# Patient Record
Sex: Male | Born: 1940 | Race: White | Hispanic: No | Marital: Married | Smoking: Never smoker
Health system: Southern US, Community
[De-identification: ages and names within clinical notes are randomized; demographics above are authoritative.]

## PROBLEM LIST (undated history)

## (undated) DIAGNOSIS — I499 Cardiac arrhythmia, unspecified: Secondary | ICD-10-CM

## (undated) DIAGNOSIS — E039 Hypothyroidism, unspecified: Secondary | ICD-10-CM

## (undated) DIAGNOSIS — N200 Calculus of kidney: Secondary | ICD-10-CM

## (undated) DIAGNOSIS — I350 Nonrheumatic aortic (valve) stenosis: Secondary | ICD-10-CM

## (undated) DIAGNOSIS — M069 Rheumatoid arthritis, unspecified: Secondary | ICD-10-CM

## (undated) DIAGNOSIS — N189 Chronic kidney disease, unspecified: Secondary | ICD-10-CM

## (undated) DIAGNOSIS — R112 Nausea with vomiting, unspecified: Secondary | ICD-10-CM

## (undated) DIAGNOSIS — Z950 Presence of cardiac pacemaker: Secondary | ICD-10-CM

## (undated) DIAGNOSIS — F32A Depression, unspecified: Secondary | ICD-10-CM

## (undated) DIAGNOSIS — Z9889 Other specified postprocedural states: Secondary | ICD-10-CM

## (undated) DIAGNOSIS — I10 Essential (primary) hypertension: Secondary | ICD-10-CM

## (undated) DIAGNOSIS — J45909 Unspecified asthma, uncomplicated: Secondary | ICD-10-CM

## (undated) DIAGNOSIS — M1990 Unspecified osteoarthritis, unspecified site: Secondary | ICD-10-CM

## (undated) DIAGNOSIS — Z87442 Personal history of urinary calculi: Secondary | ICD-10-CM

## (undated) DIAGNOSIS — Z9689 Presence of other specified functional implants: Secondary | ICD-10-CM

## (undated) DIAGNOSIS — K219 Gastro-esophageal reflux disease without esophagitis: Secondary | ICD-10-CM

## (undated) HISTORY — PX: JOINT REPLACEMENT: SHX530

## (undated) HISTORY — PX: TONSILLECTOMY: SUR1361

## (undated) HISTORY — PX: BACK SURGERY: SHX140

## (undated) HISTORY — PX: INSERT / REPLACE / REMOVE PACEMAKER: SUR710

---

## 2002-06-28 ENCOUNTER — Ambulatory Visit (HOSPITAL_COMMUNITY): Admission: RE | Admit: 2002-06-28 | Discharge: 2002-06-28 | Payer: Self-pay | Admitting: Rheumatology

## 2002-10-18 ENCOUNTER — Encounter: Payer: Self-pay | Admitting: Orthopedic Surgery

## 2002-10-21 ENCOUNTER — Inpatient Hospital Stay (HOSPITAL_COMMUNITY): Admission: RE | Admit: 2002-10-21 | Discharge: 2002-10-24 | Payer: Self-pay | Admitting: Orthopedic Surgery

## 2003-02-26 ENCOUNTER — Encounter (HOSPITAL_COMMUNITY): Admission: RE | Admit: 2003-02-26 | Discharge: 2003-05-27 | Payer: Self-pay | Admitting: Internal Medicine

## 2003-02-26 ENCOUNTER — Encounter: Payer: Self-pay | Admitting: Internal Medicine

## 2003-10-20 ENCOUNTER — Inpatient Hospital Stay (HOSPITAL_COMMUNITY): Admission: RE | Admit: 2003-10-20 | Discharge: 2003-10-22 | Payer: Self-pay | Admitting: Orthopedic Surgery

## 2008-03-31 ENCOUNTER — Encounter: Admission: RE | Admit: 2008-03-31 | Discharge: 2008-03-31 | Payer: Self-pay | Admitting: Rheumatology

## 2011-04-29 NOTE — H&P (Signed)
NAME:  George Johnston, George Johnston NO.:  [id number]   MEDICAL RECORD NO.:  [id number]                   PATIENT TYPE:  INP   LOCATION:                                       FACILITY:  [facility]   PHYSICIAN:  Mila Homer. Sherlean Foot, M.D.              DATE OF BIRTH:  [birth year]   DATE OF ADMISSION:  10/20/2003  DATE OF DISCHARGE:                                HISTORY & PHYSICAL   CHIEF COMPLAINT:  Right knee pain.   HISTORY OF PRESENT ILLNESS:  George Johnston is a 70 year old white male with eight  to nine month history of right knee pain.  The patient recently underwent a  left total knee arthroplasty on October 22, 2003, and doing remarkably  well.  Chronic knee pain is described as a sharp knifelike pain in the  medial aspect of the right knee.  Mechanical symptoms include catching.  he  has no assistive devices.  Most of his right knee pain occurs with  inactivity at night, sitting for long periods of time or walking long  distances.  The patient has tried and failed conservative treatment.  Therefore, the patient will be admitted to [facility]  on October 20, 2003, for right total knee replacement.  X-rays of the right  knee show mainly medial compartment collapse of the right knee.   ALLERGIES:  ERYTHROMYCIN causes nausea.   MEDICATIONS:  1. Sulfasalazine 500 mg four times a day.  2. Prednisone 500 mg p.o. b.i.d.  3. Sulindac 200 mg p.o. b.i.d.  4. Triamterene/hydrochlorothiazide 75/50 one p.o. daily.  5. Lipitor 10 mg one p.o. daily.   PAST MEDICAL HISTORY:  1. Hypertension.  2. Hypercholesterolemia.  3. Arthritis.   PAST SURGICAL HISTORY:  1. Tonsillectomy in 1949.  2. Left knee scope in 1998.  3. Left knee scope in 2000.  4. Left knee replacement in 2003.  The patient states he had no complications with the anesthesia involved with  any of the above procedures.  He required no blood products.   SOCIAL HISTORY:  Denies any tobacco use.   He drinks approximately a glass of  wine daily.  He is married.  He has adult children.  He lives in a two-story  home with four steps to the usual entrance.  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   PRIMARY CARE PHYSICIAN:  Buren Kos, M.D., phone number is [phone].   FAMILY HISTORY:  Mother is alive at age 10, the only known health problem is  hypertension.  Father is deceased of heart attack at age 67.  He also had  diabetes mellitus.  He has one living sister whose only known health problem  is breast cancer which she is in remission from.   REVIEW OF SYMPTOMS:  The patient denies any chest pain, shortness of breath,  PND, orthopnea.  He does  wear glasses.  He has nocturia two times a night.  Recent colonoscopy in April of this year and reported being benign.  Otherwise review of systems is negative.   PHYSICAL EXAMINATION:  GENERAL APPEARANCE:  A well-developed, well-nourished  male who walks with a slight limp on the right.  He has a mild varus  deformity of the right knee.  Mood and affect are appropriate.  The patient  talks easily with the examiner.  VITAL SIGNS:  Height 5 feet 11 inches, weight 198 pounds.  Temperature 96.5  degrees F.  Pulse 70, respiratory rate 16, blood pressure 115/80.  HEENT:  Normocephalic and atraumatic without frontal or maxillary sinus  tenderness to palpation.  Conjunctivae pink bilaterally.  Sclerae are  nonicteric bilaterally.  No visible or external ear deformities are noted.  TMs are pearly and gray.  Ear canal does have a moderate amount of cerumen.  Nasal septum midline.  Nasal mucosa is pink and moist without polyps.  Buccal mucosa pink and moist.  Good dentition.  Pharynx without exudate or  erythema. Tongue and uvula are midline.  NECK:  Trachea midline.  No lymphadenopathy.  Carotids are 2+ without  bruits.  No tenderness along the cervical spine.  Full range of motion of  the cervical spine.  BACK:  Nontender to palpation  over the thoracic and lumbar spine.  LUNGS:  Clear to auscultation bilaterally.  CARDIOVASCULAR:  Regular rate and rhythm with no murmurs, rubs, or gallops  noted.  ABDOMEN:  Nontender to palpation.  Positive bowel sounds throughout.  No  hepatomegaly or splenomegaly.  MUSCULOSKELETAL:  Upper extremities full range of motion.  Otherwise upper  extremities neurovascular and motor intact.  Lower extremities show  bilateral hips with full range of motion.  Right knee positive for crepitus,  full extension, flexed to 110 degrees.  Mild varus deformity noted. Medial  joint line tenderness to palpation.  Varus stressing reveals no laxity  causing some mild medial joint pain.  Mild edema noted about the knee.  Lower legs with no edema.  Dorsal and pedal pulses are 2+ bilaterally and  equal and symmetric.  Left knee is 0 to 120 degrees flexion.  No tenderness  along the incision.  The incision is well healed.  NEUROLOGIC:  The patient is alert and oriented x3.  Cranial nerves II-XII  are grossly intact.  Strength of the upper extremities revealed 5/5  throughout.  Deep tendon reflexes of the upper extremities are 2+  bilaterally and equal and symmetric.  Lower leg strength and resistance are  5/5 throughout.  Deep tendon reflexes are 2+bilaterally and are equal and  symmetric.   IMPRESSION:  1. Right knee medial compartment osteoarthritis.  2. Left total knee arthroplasty October 22, 2003.  3. Hypertension.  4. Hypercholesterolemia.   PLAN:  The patient will be admitted to [facility] on  October 20, 2003, and undergo a right total knee replacement.     Richardean Canal, P.A.                       Mila Homer. Sherlean Foot, M.D.   GC/MEDQ  D:  10/14/2003  T:  10/14/2003  Job:  [id number]

## 2011-04-29 NOTE — Op Note (Signed)
NAME:  George Johnston, George Johnston                           ACCOUNT NO.:  [id number]   MEDICAL RECORD NO.:  [id number]                   PATIENT TYPE:  INP   LOCATION:  5039                                 FACILITY:  [facility]   PHYSICIAN:  Mila Homer. Sherlean Foot, M.D.              DATE OF BIRTH:  05-10-1941   DATE OF PROCEDURE:  10/21/2002  DATE OF DISCHARGE:                                 OPERATIVE REPORT   PREOPERATIVE DIAGNOSIS:  Left knee osteoarthritis.   POSTOPERATIVE DIAGNOSIS:  Left knee osteoarthritis.   OPERATION/PROCEDURE:  Left total knee arthroplasty.   SURGEON:  Mila Homer. Sherlean Foot, M.D.   ASSISTANT:  Legrand Pitts. Duffy, P.A.   ANESTHESIA:  Spinal.   INDICATIONS:  The patient is status post failure of conservative measures  for osteoarthritis of the knee.  Informed consent was obtained.   DESCRIPTION OF PROCEDURE:  The patient was laid supine and administered  general anesthesia.  The left lower extremity was prepped and draped in the  usual sterile fashion.  Straight midline incision was made with a #10 blade.  [place] was used to make a median parapatellar arthrotomy.  There was lots  and lots of synovitis in the joint.  A complete synovectomy was performed.  At this point the patella was everted and measured at 25 mm and then reamed  down to 16 mm and a 35 mm template was used to drill three lug holes. With  the prosthetic patellar trial in place, it also measured 25 mm.  At this  point we left the patella everted, created a medial subperiosteal sleeve due  to the deep MCL ligament and went around to but not through the  semimembranosus tendon.  Then I brought the knee into flexion, cut the ACL  and PCL, delivered the two anteriorly.  Using the external medullary  alignment guide, used to make a cut to remove 2 mm of bone off of the medial  total plateau.  At this point I made that cut, removed the excess bone and  turned attention to the femur.  _______ I drilled a drill hole in the  femur.  I tamped on the intramedullary guide, then came down and pinned the distal  femoral cutting block set at 6 degrees valgus cut and this was a three  degree distance from the cutting block.  I removed the intramedullary guide  and made a distal femoral cut and removed the distal femoral cutting block.  I then drew the intercondylar access and the posterior condylar angle  measured 3 degrees, so I pinned it through the 3 degree hole and _________  to the size 8 to always downsize the components.  Then made the anterior,  posterior and chamfer cuts.  We then placed a lamina spreader to the lateral  compartment, removed the posterior medial osteophytes, medial meniscus,  ACL  and PCL.  I then placed a lamina  spreader in the medial compartment to the  lateral meniscus, posterolateral condylar osteophytes.  I then used spacer  blocks to check my alignment and realized I had made somewhat of a varus cut  and so I recut the tibia, taking 2 mm more bone off the lateral side.  After  having done this, the cut looked very perpendicular to the anatomic axis of  the tibia and with a 14 spacer block in place, I had good flexion,  extension, and gap balance.  I then finished the femur with the finishing  block, cutting our notch, drilling our lug holes.  I finished the tibia with  a size 7 tray and then with a size 7 tibia size E femur, size 14 trial  insert, and a size 35 patella, I had excellent flexion, extension, and gap  balance.  Full flexion, drop and dangle was back to 120 degrees.  At this  point I removed the trial components, irrigated copiously and then cemented  and sized the tibia first, sized _________ second, removing all excess  cement, snapped in the real E line polyethylene insert and went into  extension and then cemented in the patella component.  Once I had done this,  I let the cement harden and then let the tourniquet down and cauterized all  bleeding vessels.  I then left  the Hemovac deep in the arthrotomy, closed  the arthrotomy with interrupted #1 Vicryl sutures, the soft tissues with  interrupted 0 Vicryl suture running subcuticular Vicryl stitch and then the  skin staples placed in flexion and cleared.  Dressed with Adaptic 4 x 4s,  sterile Webril and TED stocking.  No complications.  Drains - one Hemovac.  Tourniquet time was exactly 60 minutes.  Estimated blood loss 300 cc.                                                Mila Homer. Sherlean Foot, M.D.    SDL/MEDQ  D:  10/21/2002  T:  10/21/2002  Job:  [id number]

## 2011-04-29 NOTE — H&P (Signed)
NAME:  George Johnston, George Johnston                           ACCOUNT NO.:  [id number]   MEDICAL RECORD NO.:  [id number]                   PATIENT TYPE:  INP   LOCATION:  NA                                   FACILITY:  [facility]   PHYSICIAN:  Mila Homer. Sherlean Foot, M.D.              DATE OF BIRTH:  December 24, 1940   DATE OF ADMISSION:  DATE OF DISCHARGE:                                HISTORY & PHYSICAL   CHIEF COMPLAINT:  Progressively worsening left knee pain since 1995.   HISTORY OF PRESENT ILLNESS:  This 70 year old white male patient presented  to Dr. Sherlean Foot with a history of a left knee arthroscopy by Dr. Darrelyn Hillock in  1995 and by Dr. Thurston Hole in 1999. He had a gradual onset of progressive  worsening left knee pain since about 1995. He has had no known injury to the  knee. The pain has been getting worse and is known described as a constant  throbbing pain which becomes sharp at times, located mostly over the medial  aspect of the knee. The patient also seems to radiate into the posterior  aspect of the knee and from his mid calf to mid thigh, especially at night.  The pain increases with any extended period of time where he has to walk and  then decreases with the use of OxyContin. The knee does grind and feels like  it might give way at times. It also swells and keeps him up a fair amount at  night. He is very stiff first thing in the morning. There is no popping,  locking, or catching. He does wear a pull-over knee sleeve on his knee at  times and that does help him get around. He had been getting cortisone  injections about every six weeks, but the last injection he got several  weeks ago has not helped. He does walk with a pretty antalgic gait and does  not use any assistive devices.   ALLERGIES:  Darvocet and erythromycin both cause GI upset and nausea.   CURRENT MEDICATIONS:  1. OxyContin 10 mg one tablet p.o. q.d. p.r.n. pain  2. Triamterene/hydrochlorothiazide 75/50 mg one tablet p.o. q.d.  3.  Prednisone 5 mg one tablet p.o. q.d.  4. Vioxx 25 mg one tablet p.o. q.d.  5. Lipitor 10 mg one tablet p.o. q.d.  6. Sulfasalazine 50 mg two tablets p.o. b.i.d.  7. Vitamins one tablet p.o. q.d.   PAST MEDICAL HISTORY:  He has had hypertension for the last year followed by  Dr. Burton Apley. He does have osteoarthritis which is followed by Dr.  Kellie Simmering, and that is why he is taking the sulfasalazine and prednisone in  addition to the Vioxx. He has had that since 1993. He denies any history of  diabetes mellitus, thyroid disease, hiatal hernia, peptic ulcer disease,  heart disease, asthma, reflux, or any other chronic medical conditions other  than noted previously.  PAST SURGICAL HISTORY:  1. Tonsillectomy in 1949.  2. Left knee arthroscopy by Dr. Ranee Gosselin 1995.  3. Left knee arthroscopy by Dr. Salvatore Marvel 1999.   SOCIAL HISTORY:  In the summer, he smokes two cigars a week and has been  doing that for two to three years. He does not smoke any other tobacco  products. He drinks a glass of wine a day. He does not use any drugs other  than his prescriptions. He is married and has two children. He and his wife  life in a two-story house with five steps into the main entrance. Bedroom is  on the second floor. 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 His medical doctor is Dr. Burton Apley, and his phone number is [phone].   FAMILY HISTORY:  His mother is alive at age 41 with gout and hypertension.  His father died at the age of 91 with a heart attack, hypertension, and  diabetes. He has one sister alive at age 3 with a history of breast cancer.  His daughter is 38 and his son is 85, and they are both alive and healthy.   REVIEW OF SYMPTOMS:  He does wears glasses for reading. He has allergies  every April and October. He did have oral surgery several months ago due to  some lumps in his mouth, and he has had no problems since that time. He does  have  a power of attorney, his wife, Nysir Fergusson. All other systems are  negative and noncontributory.   PHYSICAL EXAMINATION:  GENERAL:  Well-developed, well-nourished white male  who wakes with a significant limp on the left and an antalgic gait. Mood and  affect are appropriate. Talks easily with examiner. Height 5 feet 11 inches,  weight 196 pounds, and BMI is 26-1/2.  VITAL SIGNS:  Temperature 97.7 degrees Fahrenheit, pulse 88, respirations  18, and blood pressure 152/90.  HEENT:  Normocephalic, atraumatic without principal or maxillary sinus  tenderness to palpation. Conjunctivae are pink. Sclerae are anicteric.  PERRLA. EOMs intact. No visible external ear deformities. Hearing grossly  intact. Tympanic membranes pearly gray bilaterally with good light reflux.  Nose and nasal septum midline. Nasal mucosa pink and moist without exudates  or polyps noted. Buccal mucosa pink and moist. Good dentition. Pharynx  without erythema or exudates. Tongue and uvula midline. Tongue without  fasciculations and uvula rises equally with phonation.  NECK:  No visible masses or lesions noted. Trachea midline. No palpable  lymphadenopathy nor thyromegaly. Carotids +2 bilaterally without bruits.  Full range of motion and nontender to palpation along the entire length of  the cervical spine.  CARDIOVASCULAR:  Heart rate and rhythm regular. S1 and S2 present without  rubs, clicks, or murmurs noted.  RESPIRATORY:  Respirations even and unlabored. Breath sounds clear to  auscultation bilaterally without rales or wheezes noted.  ABDOMEN:  Rounded abdominal contour. Bowel sounds presented x4 quadrants.  Soft, nontender to palpation without hepatosplenomegaly or CVA tenderness.  Femoral pulses +2 bilaterally. Nontender to palpation along the entire  length of the vertebral column.  BREASTS/GENITOURINARY/RECTAL:  These exams deferred at this time. MUSCULOSKELETAL:  No obvious deformities in bilateral upper  extremities with  full range of motion of these extremities without pain. Radial pulses are  +2. He has full range of motion of his hips, ankles, and toes bilaterally.  DP and PT pulses are +2. No edema noted of his right leg. He does  have  +1  lower extremity edema on the left. Right knee has no obvious deformity. Full  extension and flexion to 118 degrees. There is a moderate amount of crepitus  with range of motion of the knee but no pain. No pain with palpation along  the joint line, and there is a +1 effusion in the knee. He is stable to  varus and valgus stress with a negative Homans sign, McMurray's, and  anterior drawer. Left knee has full extension and flexion only to 108  degrees, and there is a +2 effusion in the knee. There is a moderate amount  of crepitus with range of motion in the knee, and there is no pain with  palpation along the joint line. He has a negative Homans sign and stable to  varus and valgus stress. Negative McMurray's and anterior drawer.  NEUROLOGICAL:  Alert and oriented x3. Cranial nerves II-XII are grossly  intact. Strength 5/5 bilateral upper and lower extremities. Rapid  alternating movements intact. Deep tendon reflexes 2+ bilateral upper and  lower extremities. Sensation intact to light touch.   RADIOLOGIC FINDINGS:  X-rays taken on his left knee in 1/03 showed  tricompartmental osteoarthritis which is fairly severe.   IMPRESSION:  1. Osteoarthritis, left knee.  2. Hypertension.  3. Osteoarthritis.   PLAN:  Mr. Serano will be admitted to [facility] on 10/21/02 where he  will undergo a left total knee arthroplasty by Dr. Mila Homer. Lucey. He will  undergo all the routine preoperative laboratory tests and studies prior to  this procedure. If he has any medical issues while he is hospitalized, we  will consult Dr. Su Hilt.     Legrand Pitts Duffy, P.A.                      Mila Homer. Sherlean Foot, M.D.    KED/MEDQ  D:  10/11/2002  T:  10/11/2002   Job:  [id number]

## 2011-04-29 NOTE — Discharge Summary (Signed)
NAME:  George Johnston, George Johnston                           ACCOUNT NO.:  [id number]   MEDICAL RECORD NO.:  [id number]                   PATIENT TYPE:  INP   LOCATION:  5015                                 FACILITY:  [facility]   PHYSICIAN:  Mila Homer. Sherlean Foot, M.D.              DATE OF BIRTH:  [birth year]   DATE OF ADMISSION:  10/20/2003  DATE OF DISCHARGE:  10/22/2003                                 DISCHARGE SUMMARY   ADMISSION DIAGNOSES:  1. Osteoarthritis medial compartment, right knee.  2. History of left total knee arthroplasty.  3. Hypertension.  4. Hypercholesterolemia.   DISCHARGE DIAGNOSES:  1. History of bilateral knee osteoarthritis, status post left knee     replacement, November 2003 and right knee November 2004.  2. Acute blood loss anemia secondary to surgery.  3. Constipation.  4. Hypertension.  5. Hypercholesterolemia.   SURGICAL PROCEDURE:  On October 20, 2003, Mr. Provencal underwent a total knee  arthroplasty by Dr. Georgena Spurling, assisted by Joan Mayans, PA-C. He had a  NexGen Legacy knee, LPS flex size F right placed with an All-Poly patella  standard size 35-mm diameter, 9-mm thickness; a Legacy knee LPS flex  articular surface size EF 10-mm height with a fluted stem tibial component  size 6.   COMPLICATIONS:  None.   CONSULTATIONS:  1. Physical therapy consult on October 21, 2003.  2. Occupational therapy consult on October 22, 2003.   HISTORY OF PRESENT ILLNESS:  This 70 year old white male presented to Dr.  Sherlean Foot with an eight to nine month history of right knee pain. He has a  history of a left knee replacement in November 2003 and he did well. His  right knee pain was now a sharp, knife-like sensation over the medial aspect  of the knee without radiation. The knee catches and was worse if sits for  long periods of walks long distance. He has failed conservative treatment  and because of that he presented for right knee replacement.   HOSPITAL COURSE:  Mr. Brogden  tolerated his surgical procedure well without  immediate postoperative complications. He was transferred to 5000. On  postoperative day #1, T-max was 99.1 and vital signs were stable.  Hemoglobin was 10.4, hematocrit 30.7.  He was started on PT per protocol.  Hemovac was discontinued.   On postoperative day #2, T-max was 100 and vital signs were stable.  Hemoglobin was 9.6, hematocrit 28.7. The right knee incision was well  approximated with staples and no drainage.  The Marcaine pump was  discontinued.  He was doing well very with physical therapy and it was felt  that he was ready for discharge home later today. He did have some  constipation which was treated with a Dulcolax suppository.    DISCHARGE INSTRUCTIONS:   DIET:  He can resume his regular prehospitalization diet.   MEDICATIONS:  He may resume his prehospitalization medications except no  Sulindac while on Lovenox.  These include:  1. Sulfasalazine 500 mg p.o. q.i.d.  2. Prednisone 5 mg p.o. b.i.d.  3. Triamterene/hydrochlorothiazide 75/50 mg one tablet p.o. q.a.m.  4. Lipitor 10 mg one tablet p.o. q.a.m.   ADDITIONAL MEDICATIONS:  1. Lovenox 40 mg subcu daily with last dose on November 03, 2003.  2. Percocet 5/325 mg one to two p.o. q.4h. p.r.n. pain.   ACTIVITY:  Can be out of bed, weightbearing as tolerated on the right leg  with use of a walker. He is to have home CPM 0-90 degrees six to eight hours  a day and home health PT per [facility].  He is to have PT and  OT per rehab protocol.   WOUND CARE:  Please keep the right knee incision clean and dry. He may  shower after no drainage from the wound for two days. He is notify Dr. Sherlean Foot  if temperature greater than or equal 101.5 degrees Fahrenheit, chills, pain  unrelieved by pain medications, or follow up with drainage from the wound.  He needs ice pack to the right knee as needed to help with pain.   FOLLOWUP:  He is to follow up with Dr. Sherlean Foot in  our office in approximately  10-12 days.  He needs to call [phone] for that  appointment.   LABORATORY DATA:  Chest x-ray done October 17, 2003, showed stable chest  with no changes.   On October 21, 2003, hemoglobin was 10.4, hematocrit 30.7.  On October 22, 2003, white count was 7.4, hemoglobin 9.6, hematocrit 28.7, and platelet  count 267,000.   On October 17, 2003, BUN 24, creatinine 1.3. On October 21, 2003, glucose  was 138. On October 22, 2003, sodium was 134, potassium 3.7, chloride 98,  CO2 31, BUN 11, creatinine 1.1, glucose 112.  Calcium was 8.2. All other  laboratory studies were within normal limits.      Legrand Pitts Duffy, P.A.                      Mila Homer. Sherlean Foot, M.D.    KED/MEDQ  D:  10/22/2003  T:  10/22/2003  Job:  [id number]

## 2011-04-29 NOTE — Op Note (Signed)
NAME:  George Johnston, George Johnston                           ACCOUNT NO.:  [id number]   MEDICAL RECORD NO.:  [id number]                   PATIENT TYPE:  INP   LOCATION:  2550                                 FACILITY:  [facility]   PHYSICIAN:  Mila Homer. Sherlean Foot, M.D.              DATE OF BIRTH:  06-14-1941   DATE OF PROCEDURE:  10/20/2003  DATE OF DISCHARGE:                                 OPERATIVE REPORT   SURGEON:  Mila Homer. Sherlean Foot, M.D.   ASSISTANT:  Jamelle Rushing, P.A.   ANESTHESIA:  General plus preoperative femoral nerve block.   PREOPERATIVE DIAGNOSIS:  Right knee osteoarthritis.   POSTOPERATIVE DIAGNOSIS:  Right knee osteoarthritis.   OPERATION/PROCEDURE:  Right total knee arthroplasty.   INDICATIONS FOR PROCEDURE:  The patient has failed conservative measures for  osteoarthritis of the knee and informed consent was obtained.   DESCRIPTION OF PROCEDURE:  The patient was taken to the operating room after  obtaining preoperative femoral nerve block.  The right lower extremity was  prepped and draped after general anesthetic was performed.  A midline  incision was made with the #10 blade approximately 12 cm in length.  A clean  blade was used to make a medium parapatellar arthrotomy and perform a  synovectomy.  I also developed the deep MCL off the medial crest of the  tibia, held down in place with a 0 retractor.  I everted the patella,  measured it at 25 mm.  I then used the 32 mm reamer to ream down to 16 mm  to recreate the appropriate thickness and drill through the leg holes with  the trial in place and measured 25 mm indeed.  I then removed the prosthetic  trial and went and left the knee cap inverted and  went into flexion.  I cut  the ACL and PCL and delivered the tibia anteriorly.  I then used the  extramedullary alignment system on the tibia to remove 2 mm of bone off the  medial tibial plateau and this was an anatomic cut to the perpendicular to  the anatomic axis of the  tibia.  I then removed the cut piece of bone and  the extramedullary alignment guide and then turned attention to the femur.  I made an intramedullary drill hole in the femur and then placed an  intramedullary guide set on 6 degree valgus cut and the distal femoral  cutting block into place.  I made our distal femoral cut with a sagittal saw  and removed the guides.  I then marked out our epicondylar axis and the  posterior condylar angle measured 3 degrees so I pinned the sizing block  through the 3 degree hole which was sized into a size F.  I then removed the  sizer and placed a 4 mm cutter in place and screwed it into place.  I then  removed the anterior and  posterior and chamfer cuts with the 4 in 1.  I then  removed the cutting guide and turned our attention to the femur.  At this  point, I then used a size 6 tibial keel and at this point, I placed a drill  and keel and then placed the real trial 7, the size F femur, the 10 insert  and the 35 patella.  Balance was excellent.  Flexion and extension was  excellent.  I did convert to a high flex design here, cutting 2 more mm of  bone off the posterior condyles, trialing with the size F LPS flex.  I then  removed all trial components, copiously irrigated and cemented in the real  tibia, femur, snapped in the real polyethelene, cemented in the real poly  and had a drop and dangle test to 135 degrees and stability was excellent.  I then let the cement harden, removed all excess cement, let the tourniquet  down at 52 minutes.  I irrigated once again and I cauterized all bleeding  vessels once the tourniquet was down and then closed with interrupted #1 in  the arthrotomy.  I did leave a Hemovac deep in the arthrotomy.  I then left  a pain catheter anterior to the or superficial to the arthrotomy closure and  then closed the deep soft tissues with interrupted 0 Vicryls, a subcuticular  2-0 Vicryl and skin staples. I dressed with Adaptic, 4 x  4's, sterile Webril  and an Ace wrap and TED stockings.   COMPLICATIONS:  None.   DRAINS:  One Hemovac, one pain catheter.   ESTIMATED BLOOD LOSS:  300 cc.   COMPLICATIONS:  None.                                               Mila Homer. Sherlean Foot, M.D.    SDL/MEDQ  D:  10/20/2003  T:  10/20/2003  Job:  [id number]

## 2011-04-29 NOTE — Discharge Summary (Signed)
NAME:  JAICE, DIGIOIA                           ACCOUNT NO.:  [id number]   MEDICAL RECORD NO.:  [id number]                   PATIENT TYPE:  INP   LOCATION:  5039                                 FACILITY:  [facility]   PHYSICIAN:  Mila Homer. Sherlean Foot, M.D.              DATE OF BIRTH:  15-Feb-1941   DATE OF ADMISSION:  10/21/2002  DATE OF DISCHARGE:  10/24/2002                                 DISCHARGE SUMMARY   ADMISSION DIAGNOSES:  1. Left knee osteoarthritis.  2. Hypertension.   DISCHARGE DIAGNOSES:  1. Left  total knee arthroplasty.  2. Hypertension.   HISTORY OF PRESENT ILLNESS:  The patient is a 70 year old male with a  history of left knee  arthroplasty in 1995 and 1999. He presents with  progressively worsening left knee pain. The patient denied any recent  injury. The pain is constant. It is a throbbing to sharp type sensation  through the medial of the knee with radiation down into the calf and up into  the thigh. It worsens with any type of weightbearing activity. He does have  grinding and it does feel like it gives way. He does  have swelling and  night pain.   ALLERGIES:  1. DARVOCET.  2. ERYTHROMYCIN.   CURRENT MEDICATIONS:  1. Oxycontin 10 mg p.o. q.d. p.r.n.  2. Triamterene/ hydrochlorothiazide 75/50 mg p.o. q.d.  3. Prednisone 5 mg p.o. q.d.  4. Vioxx 45 mg p.o. q.d.  5. Lipitor 10 mg p.o. q.d.  6. Sulfasalazine 100 mg p.o. b.i.d.   PROCEDURE:  On October 21, 2002, the patient was taken to the operating  room by Dr. Darcella Cheshire and assisted by Nathanial Rancher, P.A.-C.  Under general  anesthesia the patient underwent a left  total knee arthroplasty. The  patient tolerated the procedure well. One Hemovac drain was left in place.  There were no complications and the patient received a postoperative femoral  nerve block  prior to  being discharged from the operating room to the  recovery room in good condition. Estimated blood loss was 300 cc.   CONSULTS:  The  routine following consults were requested:  1. Physical therapy.  2. Occupational therapy.  3. Case management.   HOSPITAL COURSE:  On October 21, 2002, the patient was admitted to [facility] under the care of Dr. Mila Homer. Lucey. The patient was taken  to the operating room where a left  total knee arthroplasty was performed.  The patient received a  postoperative femoral nerve block and was  transferred to the recovery room and then to the orthopedic floor in good  condition.   Over the next three postoperative days the patient developed postoperative  blood loss anemia but was asymptomatic so he did not require a blood  transfusion. He worked well with physical therapy. There were no problems  with managing his pain control initially with  a PCA and then p.o.  medications. The patient's wound remained benign for any signs of infection.  His leg remained neuromotor vascularly intact. He remained afebrile and his  vital signs remained stable.   The patient worked well with physical therapy. He was  placed on Lovenox for  DVT prophylaxis and he was felt ready for discharge to home on postoperative  day #3 in good condition.   LABORATORY DATA:  An EKG on admission was normal sinus rhythm with a right  bundle branch block at 68 beats per minute.   A CBC on October 24, 2002, WBCs 7.7, hemoglobin 8.5, hematocrit 25.3,  platelets 303. Routine chemistries on October 23, 2002:  Sodium 135,  potassium 3.6, glucose 139, BUN 10, creatinine 1.0. Routine urinalysis was  normal on admission.   DISCHARGE MEDICATIONS:  1. Colace 100 mg p.o. b.i.d.  2. Senokot 8.6 mg p.o. b.i.d.  3. Laxative  or enema of choice p.r.n.  4. Reglan 10 mg p.o. q.8h.  p.r.n.  5. Percocet 1 to  2 tablets q.4-6h. p.r.n. pain.  6. Tylenol 650 mg p.o. q.4h. p.r.n.  7. Robaxin 500 mg p.o. q.6h.  p.r.n.  8. Restoril 15 mg p.o. q.h.s. p.r.n.  9. Lovenox 30 mg subcu q.12h.  10.      Maxzide/Diazide 1  tablet p.o. q.d.  11.      Prednisone 5 mg p.o. q.d.  12.      Vioxx 25 mg p.o. q.d.  13.      Zocor 20  mg p.o. q.d.  14.      Sulfasalazine 1000 mg p.o. b.i.d.   The patient may resume preoperative medications.  1. Lovenox 40 mg injection q.d. for  a total of  11 days.  2. Oxycontin 20 mg 1 tablet p.o. q.12h.  3. Percocet 5 mg 1 to 2 tablets p.o. q.4-6h. p.r.n. pain.  4. Iron supplement 2 tablets q.d. x 1 month.  5. Laxative  p.r.n. constipation.  6. Phenergan 25 mg 1/2 tablet q.4h. p.r.n. pain if nauseous.   DISCHARGE INSTRUCTIONS:  Activity, the patient is to be out of bed weight  bearing as tolerated on his left leg with the use of a walker. Home CPM 0 to  100 degrees 6 to 8 hours a day. Diet no restrictions. Wound care, the  patient is to keep the incision clean  and dry. May shower after no  discharge from the wound for two days. Notify Dr. Sherlean Foot if any temperature  greater than 101.5, chills, pain not relieved by pain medications or foul  smelling discharge from wound.    FOLLOW UP:  The patient has a followup appointment with Dr. Sherlean Foot on  Tuesday, November 05, 2002, or Friday, the 21st. Call [phone] for a  followup appointment.   CONDITION ON DISCHARGE:  The patient's condition on discharge to home is  listed as good.     Jamelle Rushing, P.A.                      Mila Homer. Sherlean Foot, M.D.    RWK/MEDQ  D:  11/26/2002  T:  11/27/2002  Job:  [id number]

## 2011-11-08 ENCOUNTER — Other Ambulatory Visit: Payer: Self-pay | Admitting: Internal Medicine

## 2011-11-08 ENCOUNTER — Ambulatory Visit
Admission: RE | Admit: 2011-11-08 | Discharge: 2011-11-08 | Disposition: A | Discharge status: Home / Self Care | Payer: BC Managed Care – PPO | Source: Ambulatory Visit | Attending: Internal Medicine | Admitting: Internal Medicine

## 2011-11-08 DIAGNOSIS — R05 Cough: Secondary | ICD-10-CM

## 2011-11-18 ENCOUNTER — Other Ambulatory Visit: Payer: Self-pay | Admitting: Neurosurgery

## 2011-11-18 DIAGNOSIS — M549 Dorsalgia, unspecified: Secondary | ICD-10-CM

## 2011-11-23 ENCOUNTER — Ambulatory Visit
Admission: RE | Admit: 2011-11-23 | Discharge: 2011-11-23 | Disposition: A | Discharge status: Home / Self Care | Payer: BC Managed Care – PPO | Source: Ambulatory Visit | Attending: Neurosurgery | Admitting: Neurosurgery

## 2011-11-23 DIAGNOSIS — M549 Dorsalgia, unspecified: Secondary | ICD-10-CM

## 2015-07-14 ENCOUNTER — Other Ambulatory Visit: Payer: Self-pay | Admitting: Internal Medicine

## 2015-07-14 DIAGNOSIS — N200 Calculus of kidney: Secondary | ICD-10-CM

## 2015-07-15 ENCOUNTER — Ambulatory Visit
Admission: RE | Admit: 2015-07-15 | Discharge: 2015-07-15 | Disposition: A | Discharge status: Home / Self Care | Payer: Medicare Other | Source: Ambulatory Visit | Attending: Internal Medicine | Admitting: Internal Medicine

## 2015-07-15 DIAGNOSIS — N200 Calculus of kidney: Secondary | ICD-10-CM

## 2015-09-11 ENCOUNTER — Other Ambulatory Visit: Payer: Self-pay | Admitting: Neurosurgery

## 2015-09-11 DIAGNOSIS — M4316 Spondylolisthesis, lumbar region: Secondary | ICD-10-CM

## 2015-09-14 ENCOUNTER — Ambulatory Visit
Admission: RE | Admit: 2015-09-14 | Discharge: 2015-09-14 | Disposition: A | Discharge status: Home / Self Care | Payer: Medicare Other | Source: Ambulatory Visit | Attending: Neurosurgery | Admitting: Neurosurgery

## 2015-09-14 DIAGNOSIS — M4316 Spondylolisthesis, lumbar region: Secondary | ICD-10-CM

## 2015-10-01 ENCOUNTER — Other Ambulatory Visit: Payer: Self-pay | Admitting: Neurosurgery

## 2015-10-15 ENCOUNTER — Encounter (HOSPITAL_COMMUNITY): Payer: Self-pay

## 2015-10-15 ENCOUNTER — Encounter (HOSPITAL_COMMUNITY)
Admission: RE | Admit: 2015-10-15 | Discharge: 2015-10-15 | Disposition: A | Discharge status: Home / Self Care | Payer: Medicare Other | Source: Ambulatory Visit | Attending: Neurosurgery | Admitting: Neurosurgery

## 2015-10-15 DIAGNOSIS — K219 Gastro-esophageal reflux disease without esophagitis: Secondary | ICD-10-CM | POA: Insufficient documentation

## 2015-10-15 DIAGNOSIS — Z0183 Encounter for blood typing: Secondary | ICD-10-CM | POA: Insufficient documentation

## 2015-10-15 DIAGNOSIS — Z79899 Other long term (current) drug therapy: Secondary | ICD-10-CM | POA: Diagnosis not present

## 2015-10-15 DIAGNOSIS — Z01818 Encounter for other preprocedural examination: Secondary | ICD-10-CM | POA: Insufficient documentation

## 2015-10-15 DIAGNOSIS — E039 Hypothyroidism, unspecified: Secondary | ICD-10-CM | POA: Diagnosis not present

## 2015-10-15 DIAGNOSIS — I129 Hypertensive chronic kidney disease with stage 1 through stage 4 chronic kidney disease, or unspecified chronic kidney disease: Secondary | ICD-10-CM | POA: Diagnosis not present

## 2015-10-15 DIAGNOSIS — Z01812 Encounter for preprocedural laboratory examination: Secondary | ICD-10-CM | POA: Insufficient documentation

## 2015-10-15 DIAGNOSIS — N189 Chronic kidney disease, unspecified: Secondary | ICD-10-CM | POA: Insufficient documentation

## 2015-10-15 HISTORY — DX: Chronic kidney disease, unspecified: N18.9

## 2015-10-15 HISTORY — DX: Unspecified asthma, uncomplicated: J45.909

## 2015-10-15 HISTORY — DX: Other specified postprocedural states: Z98.890

## 2015-10-15 HISTORY — DX: Essential (primary) hypertension: I10

## 2015-10-15 HISTORY — DX: Nausea with vomiting, unspecified: R11.2

## 2015-10-15 HISTORY — DX: Hypothyroidism, unspecified: E03.9

## 2015-10-15 HISTORY — DX: Gastro-esophageal reflux disease without esophagitis: K21.9

## 2015-10-15 HISTORY — DX: Calculus of kidney: N20.0

## 2015-10-15 HISTORY — DX: Unspecified osteoarthritis, unspecified site: M19.90

## 2015-10-15 LAB — CBC
HCT: 41.6 % (ref 39.0–52.0)
Hemoglobin: 13.8 g/dL (ref 13.0–17.0)
MCH: 32.5 pg (ref 26.0–34.0)
MCHC: 33.2 g/dL (ref 30.0–36.0)
MCV: 97.9 fL (ref 78.0–100.0)
PLATELETS: 213 10*3/uL (ref 150–400)
RBC: 4.25 MIL/uL (ref 4.22–5.81)
RDW: 15.3 % (ref 11.5–15.5)
WBC: 4.4 10*3/uL (ref 4.0–10.5)

## 2015-10-15 LAB — COMPREHENSIVE METABOLIC PANEL
ALBUMIN: 4.1 g/dL (ref 3.5–5.0)
ALK PHOS: 63 U/L (ref 38–126)
ALT: 22 U/L (ref 17–63)
AST: 31 U/L (ref 15–41)
Anion gap: 6 (ref 5–15)
BUN: 18 mg/dL (ref 6–20)
CALCIUM: 9.4 mg/dL (ref 8.9–10.3)
CHLORIDE: 105 mmol/L (ref 101–111)
CO2: 28 mmol/L (ref 22–32)
CREATININE: 1.12 mg/dL (ref 0.61–1.24)
GFR calc Af Amer: >60 mL/min (ref >60)
GFR calc non Af Amer: >60 mL/min (ref >60)
GLUCOSE: 101 mg/dL — AB (ref 65–99)
Potassium: 3.8 mmol/L (ref 3.5–5.1)
SODIUM: 139 mmol/L (ref 135–145)
Total Bilirubin: 1 mg/dL (ref 0.3–1.2)
Total Protein: 6.7 g/dL (ref 6.5–8.1)

## 2015-10-15 LAB — SURGICAL PCR SCREEN
MRSA, PCR: NEGATIVE
STAPHYLOCOCCUS AUREUS: NEGATIVE

## 2015-10-15 LAB — TYPE AND SCREEN
ABO/RH(D): A POS
Antibody Screen: NEGATIVE

## 2015-10-15 LAB — ABO/RH: ABO/RH(D): A POS

## 2015-10-15 NOTE — Progress Notes (Signed)
PCP is Dr Lazarus Gowda Denies seeing a cardiologist. States he had a recent EKG at Dr Lazarus Gowda office and a stress test a few years ago.-request sent for report of stress test and EKG. Denies ever having a card cath or echo

## 2015-10-15 NOTE — Pre-Procedure Instructions (Signed)
George Johnston  10/15/2015      [id number] - [place], [place] - [address] AT COR[address] OF [facility] [address] [place] [address] Phone: [phone] Fax: [phone]    Your procedure is scheduled on Nov 10  Report to [facility] Admitting at 530 A.M.  Call this number if you have problems the morning of surgery:  [phone]   Remember:  Do not eat food or drink liquids after midnight.  Take these medicines the morning of surgery with A SIP OF WATER Hydrocodone-acetaminophen (Norco) if needed, Levothyroxine (Synthroid), Omeprazole (Prilosec), Tamsulosin (Flomax)  Stop taking aspirin, Ibuprofen, Aleve, BC's, Goody's, Herbal medications, Fish Oil   Do not wear jewelry, make-up or nail polish.  Do not wear lotions, powders, or perfumes.  You may wear deodorant.  Do not shave 48 hours prior to surgery.  Men may shave face and neck.  Do not bring valuables to the hospital.  [facility] is not responsible for any belongings or valuables.  Contacts, dentures or bridgework may not be worn into surgery.  Leave your suitcase in the car.  After surgery it may be brought to your room.  For patients admitted to the hospital, discharge time will be determined by your treatment team.  Patients discharged the day of surgery will not be allowed to drive home.    Special instructions:  [name] - Preparing for Surgery  Before surgery, you can play an important role.  Because skin is not sterile, your skin needs to be as free of germs as possible.  You can reduce the number of germs on you skin by washing with CHG (chlorahexidine gluconate) soap before surgery.  CHG is an antiseptic cleaner which kills germs and bonds with the skin to continue killing germs even after washing.  Please DO NOT use if you have an allergy to CHG or antibacterial soaps.  If your skin becomes reddened/irritated stop using the CHG and inform your nurse when you  arrive at Short Stay.  Do not shave (including legs and underarms) for at least 48 hours prior to the first CHG shower.  You may shave your face.  Please follow these instructions carefully:   1.  Shower with CHG Soap the night before surgery and the   morning of Surgery.  2.  If you choose to wash your hair, wash your hair first as usual with your  normal shampoo.  3.  After you shampoo, rinse your hair and body thoroughly to remove the  Shampoo.  4.  Use CHG as you would any other liquid soap.  You can apply chg directly to the skin and wash gently with scrungie or a clean washcloth.  5.  Apply the CHG Soap to your body ONLY FROM THE NECK DOWN.   Do not use on open wounds or open sores.  Avoid contact with your eyes,   ears, mouth and genitals (private parts).  Wash genitals (private parts)   with your normal soap.  6.  Wash thoroughly, paying special attention to the area where your surgery        will be performed.  7.  Thoroughly rinse your body with warm water from the neck down.  8.  DO NOT shower/wash with your normal soap after using and rinsing off  the CHG Soap.  9.  Pat yourself dry with a clean towel.            10.  Wear clean pajamas.            11.  Place clean sheets on your bed the night of your first shower and do not sleep with pets.  Day of Surgery  Do not apply any lotions/deoderants the morning of surgery.  Please wear clean clothes to the hospital/surgery center.     Please read over the following fact sheets that you were given. Pain Booklet, Coughing and Deep Breathing, Blood Transfusion Information, MRSA Information and Surgical Site Infection Prevention

## 2015-10-16 NOTE — Progress Notes (Signed)
Anesthesia Chart Review:  Pt is 74 year old male scheduled for L2-3, L3-4, L4-5 PLIF on 10/22/2015 with Dr. Lovell Sheehan.   PCP is Dr. Burton Apley.   PMH includes: HTN, CKD, hypothyroidism, post-op N/V, GERD. Never smoker. BMI 28.  Medications include: lipitor, levothyroxine, methotrexate, prilosec, cialis  Preoperative labs reviewed.    EKG 03/23/15: sinus rhythm. RBBB. Possible lateral infarct, age undetermined. Appears stable when compared to prior EKG 10/17/03.   If no changes, I anticipate pt can proceed with surgery as scheduled.   Rica Mast, FNP-BC [facility]/Anesthesiology Phone: [phone] 10/16/2015 3:02 PM

## 2015-10-21 MED ORDER — CEFAZOLIN SODIUM-DEXTROSE 2-3 GM-% IV SOLR
2.0000 g | INTRAVENOUS | Status: AC
  Administered 2015-10-22 (×2): 2 g via INTRAVENOUS
  Filled 2015-10-21: qty 50

## 2015-10-22 ENCOUNTER — Inpatient Hospital Stay (HOSPITAL_COMMUNITY): Payer: Medicare Other

## 2015-10-22 ENCOUNTER — Encounter (HOSPITAL_COMMUNITY): Payer: Self-pay | Admitting: *Deleted

## 2015-10-22 ENCOUNTER — Inpatient Hospital Stay (HOSPITAL_COMMUNITY)
Admission: RE | Admit: 2015-10-22 | Discharge: 2015-10-25 | DRG: 460 | Disposition: A | Discharge status: Home / Self Care | Payer: Medicare Other | Source: Ambulatory Visit | Attending: Neurosurgery | Admitting: Neurosurgery

## 2015-10-22 ENCOUNTER — Inpatient Hospital Stay (HOSPITAL_COMMUNITY): Payer: Medicare Other | Admitting: Anesthesiology

## 2015-10-22 ENCOUNTER — Encounter (HOSPITAL_COMMUNITY)
Admission: RE | Disposition: A | Discharge status: Home / Self Care | Payer: Medicare Other | Source: Ambulatory Visit | Attending: Neurosurgery

## 2015-10-22 ENCOUNTER — Inpatient Hospital Stay (HOSPITAL_COMMUNITY): Payer: Medicare Other | Admitting: Emergency Medicine

## 2015-10-22 DIAGNOSIS — M5116 Intervertebral disc disorders with radiculopathy, lumbar region: Secondary | ICD-10-CM | POA: Diagnosis present

## 2015-10-22 DIAGNOSIS — K219 Gastro-esophageal reflux disease without esophagitis: Secondary | ICD-10-CM | POA: Diagnosis present

## 2015-10-22 DIAGNOSIS — M4806 Spinal stenosis, lumbar region: Secondary | ICD-10-CM | POA: Diagnosis present

## 2015-10-22 DIAGNOSIS — M4316 Spondylolisthesis, lumbar region: Secondary | ICD-10-CM | POA: Diagnosis present

## 2015-10-22 DIAGNOSIS — I129 Hypertensive chronic kidney disease with stage 1 through stage 4 chronic kidney disease, or unspecified chronic kidney disease: Secondary | ICD-10-CM | POA: Diagnosis present

## 2015-10-22 DIAGNOSIS — Z881 Allergy status to other antibiotic agents status: Secondary | ICD-10-CM | POA: Diagnosis not present

## 2015-10-22 DIAGNOSIS — Z79899 Other long term (current) drug therapy: Secondary | ICD-10-CM | POA: Diagnosis not present

## 2015-10-22 DIAGNOSIS — E039 Hypothyroidism, unspecified: Secondary | ICD-10-CM | POA: Diagnosis present

## 2015-10-22 DIAGNOSIS — Z419 Encounter for procedure for purposes other than remedying health state, unspecified: Secondary | ICD-10-CM

## 2015-10-22 DIAGNOSIS — N189 Chronic kidney disease, unspecified: Secondary | ICD-10-CM | POA: Diagnosis present

## 2015-10-22 DIAGNOSIS — Z96653 Presence of artificial knee joint, bilateral: Secondary | ICD-10-CM | POA: Diagnosis present

## 2015-10-22 SURGERY — POSTERIOR LUMBAR FUSION 3 LEVEL
Anesthesia: General

## 2015-10-22 MED ORDER — SUCCINYLCHOLINE CHLORIDE 20 MG/ML IJ SOLN
INTRAMUSCULAR | Status: DC | PRN
  Administered 2015-10-22 (×2): 100 mg via INTRAVENOUS

## 2015-10-22 MED ORDER — LIDOCAINE HCL (CARDIAC) 20 MG/ML IV SOLN
INTRAVENOUS | Status: DC | PRN
  Administered 2015-10-22: 60 mg via INTRAVENOUS

## 2015-10-22 MED ORDER — ACETAMINOPHEN 325 MG PO TABS: 650.0000 mg | ORAL_TABLET | ORAL | Status: DC | PRN

## 2015-10-22 MED ORDER — DOCUSATE SODIUM 100 MG PO CAPS
100.0000 mg | ORAL_CAPSULE | Freq: Two times a day (BID) | ORAL | Status: DC
  Administered 2015-10-22 – 2015-10-25 (×6): 100 mg via ORAL
  Filled 2015-10-22 (×6): qty 1

## 2015-10-22 MED ORDER — PANTOPRAZOLE SODIUM 40 MG PO TBEC
80.0000 mg | DELAYED_RELEASE_TABLET | Freq: Every day | ORAL | Status: DC
  Administered 2015-10-22 – 2015-10-25 (×4): 80 mg via ORAL
  Filled 2015-10-22 (×5): qty 2

## 2015-10-22 MED ORDER — PHENYLEPHRINE 40 MCG/ML (10ML) SYRINGE FOR IV PUSH (FOR BLOOD PRESSURE SUPPORT)
PREFILLED_SYRINGE | INTRAVENOUS | Status: AC
  Filled 2015-10-22: qty 10

## 2015-10-22 MED ORDER — HYDROMORPHONE HCL 1 MG/ML IJ SOLN
INTRAMUSCULAR | Status: AC
  Filled 2015-10-22: qty 1

## 2015-10-22 MED ORDER — HYDROCODONE-ACETAMINOPHEN 10-325 MG PO TABS: 1.0000 | ORAL_TABLET | ORAL | Status: DC | PRN

## 2015-10-22 MED ORDER — FENTANYL CITRATE (PF) 100 MCG/2ML IJ SOLN
INTRAMUSCULAR | Status: DC | PRN
  Administered 2015-10-22 (×2): 100 ug via INTRAVENOUS
  Administered 2015-10-22 (×2): 50 ug via INTRAVENOUS
  Administered 2015-10-22 (×4): 100 ug via INTRAVENOUS
  Administered 2015-10-22: 50 ug via INTRAVENOUS

## 2015-10-22 MED ORDER — ONDANSETRON HCL 4 MG/2ML IJ SOLN
4.0000 mg | INTRAMUSCULAR | Status: DC | PRN
  Administered 2015-10-25: 4 mg via INTRAVENOUS
  Filled 2015-10-22: qty 2

## 2015-10-22 MED ORDER — SCOPOLAMINE 1 MG/3DAYS TD PT72
MEDICATED_PATCH | TRANSDERMAL | Status: AC
  Administered 2015-10-22: 1 via TRANSDERMAL
  Filled 2015-10-22: qty 1

## 2015-10-22 MED ORDER — TADALAFIL 5 MG PO TABS: 5.0000 mg | ORAL_TABLET | Freq: Every day | ORAL | Status: DC

## 2015-10-22 MED ORDER — FENTANYL CITRATE (PF) 250 MCG/5ML IJ SOLN
INTRAMUSCULAR | Status: AC
  Filled 2015-10-22: qty 5

## 2015-10-22 MED ORDER — FOLIC ACID 1 MG PO TABS
1.0000 mg | ORAL_TABLET | Freq: Every day | ORAL | Status: DC
  Administered 2015-10-23 – 2015-10-25 (×3): 1 mg via ORAL
  Filled 2015-10-22 (×3): qty 1

## 2015-10-22 MED ORDER — DIAZEPAM 5 MG PO TABS
5.0000 mg | ORAL_TABLET | Freq: Four times a day (QID) | ORAL | Status: DC | PRN
  Administered 2015-10-22 – 2015-10-24 (×6): 5 mg via ORAL
  Filled 2015-10-22 (×5): qty 1

## 2015-10-22 MED ORDER — PROMETHAZINE HCL 25 MG/ML IJ SOLN: 6.2500 mg | INTRAMUSCULAR | Status: DC | PRN

## 2015-10-22 MED ORDER — MIDAZOLAM HCL 2 MG/2ML IJ SOLN
INTRAMUSCULAR | Status: AC
  Filled 2015-10-22: qty 4

## 2015-10-22 MED ORDER — LACTATED RINGERS IV SOLN: INTRAVENOUS | Status: DC

## 2015-10-22 MED ORDER — BUPIVACAINE LIPOSOME 1.3 % IJ SUSP
20.0000 mL | Freq: Once | INTRAMUSCULAR | Status: DC
  Filled 2015-10-22: qty 20

## 2015-10-22 MED ORDER — EPHEDRINE SULFATE 50 MG/ML IJ SOLN
INTRAMUSCULAR | Status: AC
  Filled 2015-10-22: qty 1

## 2015-10-22 MED ORDER — BUPIVACAINE-EPINEPHRINE (PF) 0.5% -1:200000 IJ SOLN
INTRAMUSCULAR | Status: DC | PRN
  Administered 2015-10-22: 10 mL
  Administered 2015-10-22: 20 mL

## 2015-10-22 MED ORDER — DIPHENHYDRAMINE HCL 25 MG PO CAPS
25.0000 mg | ORAL_CAPSULE | Freq: Four times a day (QID) | ORAL | Status: DC | PRN
Start: 2015-10-22 — End: 2015-10-25
  Administered 2015-10-22 – 2015-10-25 (×9): 25 mg via ORAL
  Filled 2015-10-22 (×10): qty 1

## 2015-10-22 MED ORDER — LIDOCAINE HCL (CARDIAC) 20 MG/ML IV SOLN
INTRAVENOUS | Status: AC
  Filled 2015-10-22: qty 5

## 2015-10-22 MED ORDER — ALUM & MAG HYDROXIDE-SIMETH 200-200-20 MG/5ML PO SUSP: 30.0000 mL | Freq: Four times a day (QID) | ORAL | Status: DC | PRN

## 2015-10-22 MED ORDER — PROPOFOL 10 MG/ML IV BOLUS
INTRAVENOUS | Status: AC
  Filled 2015-10-22: qty 20

## 2015-10-22 MED ORDER — DIAZEPAM 5 MG PO TABS
ORAL_TABLET | ORAL | Status: AC
  Filled 2015-10-22: qty 1

## 2015-10-22 MED ORDER — HEMOSTATIC AGENTS (NO CHARGE) OPTIME
TOPICAL | Status: DC | PRN
  Administered 2015-10-22 (×2): 1 via TOPICAL

## 2015-10-22 MED ORDER — ROCURONIUM BROMIDE 50 MG/5ML IV SOLN
INTRAVENOUS | Status: AC
  Filled 2015-10-22: qty 1

## 2015-10-22 MED ORDER — PHENOL 1.4 % MT LIQD
1.0000 | OROMUCOSAL | Status: DC | PRN
Start: 2015-10-22 — End: 2015-10-25

## 2015-10-22 MED ORDER — HYDROMORPHONE HCL 1 MG/ML IJ SOLN
0.2500 mg | INTRAMUSCULAR | Status: DC | PRN
  Administered 2015-10-22 (×4): 0.5 mg via INTRAVENOUS

## 2015-10-22 MED ORDER — PHENYLEPHRINE HCL 10 MG/ML IJ SOLN
10.0000 mg | INTRAMUSCULAR | Status: DC | PRN
  Administered 2015-10-22: 50 ug/min via INTRAVENOUS

## 2015-10-22 MED ORDER — VANCOMYCIN HCL 1000 MG IV SOLR
INTRAVENOUS | Status: DC | PRN
  Administered 2015-10-22: 1000 mg via TOPICAL

## 2015-10-22 MED ORDER — LACTATED RINGERS IV SOLN
INTRAVENOUS | Status: DC | PRN
  Administered 2015-10-22 (×4): via INTRAVENOUS

## 2015-10-22 MED ORDER — THROMBIN 20000 UNITS EX KIT
PACK | CUTANEOUS | Status: DC | PRN
  Administered 2015-10-22 (×2): 20000 [IU] via TOPICAL

## 2015-10-22 MED ORDER — ACETAMINOPHEN 10 MG/ML IV SOLN
INTRAVENOUS | Status: AC
  Administered 2015-10-22: 1000 mg via INTRAVENOUS
  Filled 2015-10-22: qty 100

## 2015-10-22 MED ORDER — ACETAMINOPHEN 650 MG RE SUPP: 650.0000 mg | RECTAL | Status: DC | PRN

## 2015-10-22 MED ORDER — MORPHINE SULFATE (PF) 2 MG/ML IV SOLN
1.0000 mg | INTRAVENOUS | Status: DC | PRN
  Administered 2015-10-22: 4 mg via INTRAVENOUS
  Administered 2015-10-22 – 2015-10-24 (×3): 2 mg via INTRAVENOUS
  Filled 2015-10-22 (×2): qty 1
  Filled 2015-10-22: qty 2
  Filled 2015-10-22: qty 1

## 2015-10-22 MED ORDER — ALBUMIN HUMAN 5 % IV SOLN
INTRAVENOUS | Status: DC | PRN
  Administered 2015-10-22: 11:00:00 via INTRAVENOUS

## 2015-10-22 MED ORDER — OXYCODONE-ACETAMINOPHEN 5-325 MG PO TABS
1.0000 | ORAL_TABLET | ORAL | Status: DC | PRN
  Administered 2015-10-22 – 2015-10-23 (×3): 2 via ORAL
  Administered 2015-10-23: 1 via ORAL
  Administered 2015-10-23 (×3): 2 via ORAL
  Administered 2015-10-24 (×2): 1 via ORAL
  Administered 2015-10-24 – 2015-10-25 (×6): 2 via ORAL
  Filled 2015-10-22 (×3): qty 2
  Filled 2015-10-22: qty 1
  Filled 2015-10-22 (×3): qty 2
  Filled 2015-10-22: qty 1
  Filled 2015-10-22 (×4): qty 2
  Filled 2015-10-22: qty 1
  Filled 2015-10-22 (×2): qty 2

## 2015-10-22 MED ORDER — CEFAZOLIN SODIUM-DEXTROSE 2-3 GM-% IV SOLR
INTRAVENOUS | Status: AC
  Filled 2015-10-22: qty 50

## 2015-10-22 MED ORDER — CEFAZOLIN SODIUM-DEXTROSE 2-3 GM-% IV SOLR
2.0000 g | Freq: Three times a day (TID) | INTRAVENOUS | Status: AC
  Administered 2015-10-22 – 2015-10-23 (×2): 2 g via INTRAVENOUS
  Filled 2015-10-22 (×2): qty 50

## 2015-10-22 MED ORDER — SODIUM CHLORIDE 0.9 % IJ SOLN
INTRAMUSCULAR | Status: AC
  Filled 2015-10-22: qty 10

## 2015-10-22 MED ORDER — MENTHOL 3 MG MT LOZG: 1.0000 | LOZENGE | OROMUCOSAL | Status: DC | PRN

## 2015-10-22 MED ORDER — HYDROCODONE-ACETAMINOPHEN 5-325 MG PO TABS: 1.0000 | ORAL_TABLET | ORAL | Status: DC | PRN

## 2015-10-22 MED ORDER — ATORVASTATIN CALCIUM 10 MG PO TABS
10.0000 mg | ORAL_TABLET | ORAL | Status: DC
  Administered 2015-10-23: 10 mg via ORAL
  Filled 2015-10-22: qty 1

## 2015-10-22 MED ORDER — TAMSULOSIN HCL 0.4 MG PO CAPS
0.4000 mg | ORAL_CAPSULE | Freq: Every day | ORAL | Status: DC
  Administered 2015-10-23 – 2015-10-25 (×3): 0.4 mg via ORAL
  Filled 2015-10-22 (×3): qty 1

## 2015-10-22 MED ORDER — PROPOFOL 10 MG/ML IV BOLUS
INTRAVENOUS | Status: DC | PRN
  Administered 2015-10-22: 50 mg via INTRAVENOUS
  Administered 2015-10-22: 100 mg via INTRAVENOUS

## 2015-10-22 MED ORDER — ONDANSETRON HCL 4 MG/2ML IJ SOLN
INTRAMUSCULAR | Status: DC | PRN
  Administered 2015-10-22: 4 mg via INTRAVENOUS

## 2015-10-22 MED ORDER — DEXAMETHASONE SODIUM PHOSPHATE 10 MG/ML IJ SOLN
INTRAMUSCULAR | Status: AC
  Administered 2015-10-22: 10 mg via INTRAVENOUS
  Filled 2015-10-22: qty 1

## 2015-10-22 MED ORDER — LEVOTHYROXINE SODIUM 25 MCG PO TABS
125.0000 ug | ORAL_TABLET | Freq: Every day | ORAL | Status: DC
  Administered 2015-10-23 – 2015-10-25 (×3): 125 ug via ORAL
  Filled 2015-10-22 (×6): qty 1

## 2015-10-22 MED ORDER — BACITRACIN ZINC 500 UNIT/GM EX OINT
TOPICAL_OINTMENT | CUTANEOUS | Status: DC | PRN
Start: 2015-10-22 — End: 2015-10-22
  Administered 2015-10-22: 1 via TOPICAL

## 2015-10-22 MED ORDER — ROCURONIUM BROMIDE 100 MG/10ML IV SOLN
INTRAVENOUS | Status: DC | PRN
  Administered 2015-10-22: 50 mg via INTRAVENOUS

## 2015-10-22 MED ORDER — BISACODYL 10 MG RE SUPP: 10.0000 mg | Freq: Every day | RECTAL | Status: DC | PRN

## 2015-10-22 MED ORDER — BUPIVACAINE LIPOSOME 1.3 % IJ SUSP
INTRAMUSCULAR | Status: DC | PRN
  Administered 2015-10-22: 20 mL

## 2015-10-22 MED ORDER — ONDANSETRON HCL 4 MG/2ML IJ SOLN
INTRAMUSCULAR | Status: AC
  Filled 2015-10-22: qty 2

## 2015-10-22 MED ORDER — MIDAZOLAM HCL 5 MG/5ML IJ SOLN
INTRAMUSCULAR | Status: DC | PRN
  Administered 2015-10-22: 2 mg via INTRAVENOUS

## 2015-10-22 MED ORDER — SUCCINYLCHOLINE CHLORIDE 20 MG/ML IJ SOLN
INTRAMUSCULAR | Status: AC
  Filled 2015-10-22: qty 1

## 2015-10-22 MED ORDER — VANCOMYCIN HCL 1000 MG IV SOLR
INTRAVENOUS | Status: AC
  Filled 2015-10-22: qty 1000

## 2015-10-22 MED ORDER — SODIUM CHLORIDE 0.9 % IR SOLN
Status: DC | PRN
  Administered 2015-10-22: 09:00:00

## 2015-10-22 SURGICAL SUPPLY — 72 items
BAG DECANTER FOR FLEXI CONT (MISCELLANEOUS) ×2 IMPLANT
BENZOIN TINCTURE PRP APPL 2/3 (GAUZE/BANDAGES/DRESSINGS) ×2 IMPLANT
BLADE CLIPPER SURG (BLADE) IMPLANT
BRUSH SCRUB EZ PLAIN DRY (MISCELLANEOUS) ×2 IMPLANT
BUR MATCHSTICK NEURO 3.0 LAGG (BURR) ×2 IMPLANT
BUR PRECISION FLUTE 6.0 (BURR) ×2 IMPLANT
CAGE ALTERA 8X12-8 (Cage) ×6 IMPLANT
CANISTER SUCT 3000ML PPV (MISCELLANEOUS) ×2 IMPLANT
CAP REVERE LOCKING (Cap) ×16 IMPLANT
CONN CROSSLINK REV 6.35 48-60 (Connector) ×2 IMPLANT
CONNECTOR CRSLNK REV6.35 48-60 (Connector) ×1 IMPLANT
CONT SPEC 4OZ CLIKSEAL STRL BL (MISCELLANEOUS) ×2 IMPLANT
COVER BACK TABLE 60X90IN (DRAPES) ×2 IMPLANT
DRAPE C-ARM 42X72 X-RAY (DRAPES) ×4 IMPLANT
DRAPE LAPAROTOMY 100X72X124 (DRAPES) ×2 IMPLANT
DRAPE POUCH INSTRU U-SHP 10X18 (DRAPES) ×2 IMPLANT
DRAPE PROXIMA HALF (DRAPES) ×2 IMPLANT
DRAPE SURG 17X23 STRL (DRAPES) ×8 IMPLANT
ELECT BLADE 4.0 EZ CLEAN MEGAD (MISCELLANEOUS) ×2
ELECT REM PT RETURN 9FT ADLT (ELECTROSURGICAL) ×2
ELECTRODE BLDE 4.0 EZ CLN MEGD (MISCELLANEOUS) ×1 IMPLANT
ELECTRODE REM PT RTRN 9FT ADLT (ELECTROSURGICAL) ×1 IMPLANT
EVACUATOR 1/8 PVC DRAIN (DRAIN) ×2 IMPLANT
GAUZE SPONGE 4X4 12PLY STRL (GAUZE/BANDAGES/DRESSINGS) ×2 IMPLANT
GAUZE SPONGE 4X4 16PLY XRAY LF (GAUZE/BANDAGES/DRESSINGS) ×2 IMPLANT
GLOVE BIO SURGEON STRL SZ 6.5 (GLOVE) ×4 IMPLANT
GLOVE BIO SURGEON STRL SZ7 (GLOVE) ×2 IMPLANT
GLOVE BIO SURGEON STRL SZ8 (GLOVE) ×4 IMPLANT
GLOVE BIO SURGEON STRL SZ8.5 (GLOVE) ×4 IMPLANT
GLOVE BIOGEL PI IND STRL 6.5 (GLOVE) ×1 IMPLANT
GLOVE BIOGEL PI IND STRL 7.0 (GLOVE) ×1 IMPLANT
GLOVE BIOGEL PI IND STRL 7.5 (GLOVE) ×1 IMPLANT
GLOVE BIOGEL PI INDICATOR 6.5 (GLOVE) ×1
GLOVE BIOGEL PI INDICATOR 7.0 (GLOVE) ×1
GLOVE BIOGEL PI INDICATOR 7.5 (GLOVE) ×1
GLOVE EXAM NITRILE LRG STRL (GLOVE) IMPLANT
GLOVE EXAM NITRILE MD LF STRL (GLOVE) IMPLANT
GLOVE EXAM NITRILE XL STR (GLOVE) IMPLANT
GLOVE EXAM NITRILE XS STR PU (GLOVE) IMPLANT
GOWN STRL REUS W/ TWL LRG LVL3 (GOWN DISPOSABLE) ×2 IMPLANT
GOWN STRL REUS W/ TWL XL LVL3 (GOWN DISPOSABLE) ×2 IMPLANT
GOWN STRL REUS W/TWL 2XL LVL3 (GOWN DISPOSABLE) IMPLANT
GOWN STRL REUS W/TWL LRG LVL3 (GOWN DISPOSABLE) ×2
GOWN STRL REUS W/TWL XL LVL3 (GOWN DISPOSABLE) ×2
KIT BASIN OR (CUSTOM PROCEDURE TRAY) ×2 IMPLANT
KIT ROOM TURNOVER OR (KITS) ×2 IMPLANT
MILL MEDIUM DISP (BLADE) ×2 IMPLANT
NEEDLE HYPO 21X1.5 SAFETY (NEEDLE) ×2 IMPLANT
NEEDLE HYPO 22GX1.5 SAFETY (NEEDLE) ×2 IMPLANT
NS IRRIG 1000ML POUR BTL (IV SOLUTION) ×2 IMPLANT
PACK LAMINECTOMY NEURO (CUSTOM PROCEDURE TRAY) ×2 IMPLANT
PAD ARMBOARD 7.5X6 YLW CONV (MISCELLANEOUS) ×6 IMPLANT
PATTIES SURGICAL .5 X1 (DISPOSABLE) ×4 IMPLANT
PATTIES SURGICAL 1X1 (DISPOSABLE) ×4 IMPLANT
ROD REVERE 6.35 CURVED 125MM (Rod) ×4 IMPLANT
SCREW 7.5X50MM (Screw) ×4 IMPLANT
SCREW REVERE 6.35 75X55MM (Screw) ×12 IMPLANT
SEALER BIPOLAR AQUA 6.0 (INSTRUMENTS) ×2 IMPLANT
SPONGE LAP 4X18 X RAY DECT (DISPOSABLE) IMPLANT
SPONGE NEURO XRAY DETECT 1X3 (DISPOSABLE) IMPLANT
SPONGE SURGIFOAM ABS GEL 100 (HEMOSTASIS) ×4 IMPLANT
STRIP BIOACTIVE 10CC 25X50X8 (Miscellaneous) ×2 IMPLANT
STRIP BIOACTIVE 20CC 25X100X8 (Miscellaneous) ×4 IMPLANT
STRIP CLOSURE SKIN 1/2X4 (GAUZE/BANDAGES/DRESSINGS) ×2 IMPLANT
SUT VIC AB 1 CT1 18XBRD ANBCTR (SUTURE) ×2 IMPLANT
SUT VIC AB 1 CT1 8-18 (SUTURE) ×2
SUT VIC AB 2-0 CP2 18 (SUTURE) ×4 IMPLANT
TAPE CLOTH SURG 4X10 WHT LF (GAUZE/BANDAGES/DRESSINGS) ×2 IMPLANT
TOWEL OR 17X24 6PK STRL BLUE (TOWEL DISPOSABLE) ×2 IMPLANT
TOWEL OR 17X26 10 PK STRL BLUE (TOWEL DISPOSABLE) ×2 IMPLANT
TRAY FOLEY W/METER SILVER 14FR (SET/KITS/TRAYS/PACK) ×2 IMPLANT
WATER STERILE IRR 1000ML POUR (IV SOLUTION) ×2 IMPLANT

## 2015-10-22 NOTE — Progress Notes (Signed)
Orthopedic Tech Progress Note Patient Details:  George Johnston 12-Jul-1941 [id number] Patient already has brace. Patient ID: George Johnston, male   DOB: 09-12-1941, 74 y.o.   MRN: [id number]   [name] 10/22/2015, 8:13 PM

## 2015-10-22 NOTE — H&P (Signed)
Subjective: The patient is a 74 year old white male who has complained of chronic back, buttock, and leg pain consistent with neurogenic claudication. He has failed medical management and was worked up with a lumbar MRI and lumbar x-rays. This demonstrated lumbar spondylolisthesis and spinal stenosis. I discussed the situation with the patient. We discussed the various treatment options. He has decided to proceed with surgery.   Past Medical History  Diagnosis Date  . PONV (postoperative nausea and vomiting)   . Hypertension   . Asthma     as a child  . Hypothyroidism   . Chronic kidney disease   . Kidney stone   . GERD (gastroesophageal reflux disease)   . Arthritis     Past Surgical History  Procedure Laterality Date  . Joint replacement  2002 & 2003    both knees  . Tonsillectomy      Allergies  Allergen Reactions  . Erythromycin Nausea And Vomiting    Social History  Substance Use Topics  . Smoking status: Never Smoker   . Smokeless tobacco: Not on file  . Alcohol Use: Yes     Comment: 7 drinks a week    History reviewed. No pertinent family history. Prior to Admission medications   Medication Sig Start Date End Date Taking? Authorizing Provider  atorvastatin (LIPITOR) 10 MG tablet Take 10 mg by mouth 3 (three) times a week.   Yes Historical Provider, MD  folic acid (FOLVITE) 1 MG tablet Take 1 mg by mouth daily.   Yes Historical Provider, MD  HYDROcodone-acetaminophen (NORCO) 10-325 MG tablet Take 1 tablet by mouth every 6 (six) hours as needed (pain).   Yes Historical Provider, MD  Ibuprofen (ADVIL) 200 MG CAPS Take 400 mg by mouth 4 (four) times daily as needed (pain).   Yes Historical Provider, MD  levothyroxine (SYNTHROID, LEVOTHROID) 125 MCG tablet Take 125 mcg by mouth daily before breakfast.   Yes Historical Provider, MD  methotrexate (RHEUMATREX) 2.5 MG tablet Take 10 mg by mouth 2 (two) times a week. Caution:Chemotherapy. Protect from light.   Yes Historical  Provider, MD  omeprazole (PRILOSEC) 40 MG capsule Take 40 mg by mouth daily as needed (acid reflux).   Yes Historical Provider, MD  Saw Palmetto 450 MG CAPS Take 900 mg by mouth daily.   Yes Historical Provider, MD  tadalafil (CIALIS) 5 MG tablet Take 5 mg by mouth daily.   Yes Historical Provider, MD  tamsulosin (FLOMAX) 0.4 MG CAPS capsule Take 0.4 mg by mouth daily.   Yes Historical Provider, MD     Review of Systems  Positive ROS: As above  All other systems have been reviewed and were otherwise negative with the exception of those mentioned in the HPI and as above.  Objective: Vital signs in last 24 hours: Temp:  [98.1 F (36.7 C)] 98.1 F (36.7 C) (11/10 0604) Pulse Rate:  [67] 67 (11/10 0604) Resp:  [18] 18 (11/10 0604) BP: (157)/(92) 157/92 mmHg (11/10 0604) SpO2:  [97 %] 97 % (11/10 0604)  General Appearance: Alert, cooperative, no distress, Head: Normocephalic, without obvious abnormality, atraumatic Eyes: PERRL, conjunctiva/corneas clear, EOM's intact,    Ears: Normal  Throat: Normal  Neck: Supple, symmetrical, trachea midline, no adenopathy; thyroid: No enlargement/tenderness/nodules; no carotid bruit or JVD Back: Symmetric, no curvature, ROM normal, no CVA tenderness Lungs: Clear to auscultation bilaterally, respirations unlabored Heart: Regular rate and rhythm, no murmur, rub or gallop Abdomen: Soft, non-tender,, no masses, no organomegaly Extremities: Extremities normal, atraumatic, no  cyanosis or edema Pulses: 2+ and symmetric all extremities Skin: Skin color, texture, turgor normal, no rashes or lesions  NEUROLOGIC:   Mental status: alert and oriented, no aphasia, good attention span, Fund of knowledge/ memory ok Motor Exam - grossly normal Sensory Exam - grossly normal Reflexes:  Coordination - grossly normal Gait - grossly normal Balance - grossly normal Cranial Nerves: I: smell Not tested  II: visual acuity  OS: Normal  OD: Normal   II: visual  fields Full to confrontation  II: pupils Equal, round, reactive to light  III,VII: ptosis None  III,IV,VI: extraocular muscles  Full ROM  V: mastication Normal  V: facial light touch sensation  Normal  V,VII: corneal reflex  Present  VII: facial muscle function - upper  Normal  VII: facial muscle function - lower Normal  VIII: hearing Not tested  IX: soft palate elevation  Normal  IX,X: gag reflex Present  XI: trapezius strength  5/5  XI: sternocleidomastoid strength 5/5  XI: neck flexion strength  5/5  XII: tongue strength  Normal    Data Review Lab Results  Component Value Date   WBC 4.4 10/15/2015   HGB 13.8 10/15/2015   HCT 41.6 10/15/2015   MCV 97.9 10/15/2015   PLT 213 10/15/2015   Lab Results  Component Value Date   NA 139 10/15/2015   K 3.8 10/15/2015   CL 105 10/15/2015   CO2 28 10/15/2015   BUN 18 10/15/2015   CREATININE 1.12 10/15/2015   GLUCOSE 101* 10/15/2015   No results found for: INR, PROTIME  Assessment/Plan: L2-3, 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 I have reviewed his imaging studies with him and pointed out the abnormalities. We have discussed the various treatment options including surgery. I have described the surgical treatment option of an L2-3, L3-4 and L4-5 decompression, instrumentation, and fusion. I have shown him surgical models. We have discussed the risks, benefits, alternatives, and likelihood of achieving our goals with surgery. I have answered all the patient's questions. He has decided to proceed with surgery.   Jodie Leiner D 10/22/2015 7:18 AM

## 2015-10-22 NOTE — Progress Notes (Signed)
Subjective:  The patient is somnolent but arousable. He is in no apparent distress.  Objective: Vital signs in last 24 hours: Temp:  [98.1 F (36.7 C)] 98.1 F (36.7 C) (11/10 0604) Pulse Rate:  [67] 67 (11/10 0604) Resp:  [18] 18 (11/10 0604) BP: (157)/(92) 157/92 mmHg (11/10 0604) SpO2:  [97 %] 97 % (11/10 0604)  Intake/Output from previous day:   Intake/Output this shift: Total I/O In: 2450 [I.V.:2000; Blood:200; IV Piggyback:250] Out: 1225 [Urine:575; Blood:650]  Physical exam the patient is somnolent but arousable. He is moving his lower extremities well.  Lab Results: No results for input(s): WBC, HGB, HCT, PLT in the last 72 hours. BMET No results for input(s): NA, K, CL, CO2, GLUCOSE, BUN, CREATININE, CALCIUM in the last 72 hours.  Studies/Results: Dg Lumbar Spine 2-3 Views  10/22/2015  CLINICAL DATA:  L2-5 laminectomy and fusion. Intraoperative fluoroscopic spot views. EXAM: LUMBAR SPINE - 2-3 VIEW; LUMBAR SPINE - 1 VIEW COMPARISON:  MRI lumbar spine 09/14/2015. FINDINGS: We are provided with 3 fluoroscopic intraoperative spot views of the lumbar spine. Images demonstrate pedicle screws and interbody spacers in place from L2-L5. No acute abnormality is identified. IMPRESSION: L2-5 PLIF in progress. Electronically Signed   By: Drusilla Kanner M.D.   On: 10/22/2015 14:32   Dg Lumbar Spine 1 View  10/22/2015  CLINICAL DATA:  L2-5 laminectomy and fusion. Intraoperative fluoroscopic spot views. EXAM: LUMBAR SPINE - 2-3 VIEW; LUMBAR SPINE - 1 VIEW COMPARISON:  MRI lumbar spine 09/14/2015. FINDINGS: We are provided with 3 fluoroscopic intraoperative spot views of the lumbar spine. Images demonstrate pedicle screws and interbody spacers in place from L2-L5. No acute abnormality is identified. IMPRESSION: L2-5 PLIF in progress. Electronically Signed   By: Drusilla Kanner M.D.   On: 10/22/2015 14:32   Dg C-arm 61-120 Min  10/22/2015  CLINICAL DATA:  Lumbar surgery. EXAM: DG  C-ARM 61-120 MIN COMPARISON:  MRI 09/14/2015. FINDINGS: Lumbar vertebra numbered as per prior MRI. Stable L4-L5 anterolisthesis. Postsurgical changes L2 through L5 with posterior pedicle screws and inter disc fusion devices. Prior lumbar laminectomy. Three images. 0 minutes 53.1 seconds fluoro time. IMPRESSION: Postsurgical changes lumbar spine.  Stable anterolisthesis L4 on L5. Electronically Signed   By: Maisie Fus  Register   On: 10/22/2015 14:33    Assessment/Plan: The patient is doing well.  LOS: 0 days     Stephaney Steven D 10/22/2015, 2:44 PM

## 2015-10-22 NOTE — Transfer of Care (Signed)
Immediate Anesthesia Transfer of Care Note  Patient: George Johnston  Procedure(s) Performed: Procedure(s) with comments: POSTERIOR LUMBAR FUSION 3 LEVEL (N/A) - Lumbar  two-three, lumbar three-four, Lumbar four-five, posterior lumbar interbody fusion with interbody prosthesis posterior lateral arthrodesis and posterior segmental instrumentation  Patient Location: PACU  Anesthesia Type:General  Level of Consciousness: awake, alert , oriented and patient cooperative  Airway & Oxygen Therapy: Patient Spontanous Breathing and Patient connected to nasal cannula oxygen  Post-op Assessment: Report given to RN, Post -op Vital signs reviewed and stable and Patient moving all extremities  Post vital signs: Reviewed and stable  Last Vitals:  Filed Vitals:   10/22/15 0604  BP: 157/92  Pulse: 67  Temp: 36.7 C  Resp: 18    Complications: No apparent anesthesia complications

## 2015-10-22 NOTE — Anesthesia Procedure Notes (Signed)
Procedure Name: Intubation Date/Time: 10/22/2015 7:41 AM Performed by: Ferol Luz L Pre-anesthesia Checklist: Patient identified, Emergency Drugs available, Suction available, Patient being monitored and Timeout performed Patient Re-evaluated:Patient Re-evaluated prior to inductionOxygen Delivery Method: Circle system utilized Preoxygenation: Pre-oxygenation with 100% oxygen Intubation Type: IV induction and Cricoid Pressure applied Ventilation: Mask ventilation without difficulty Laryngoscope Size: Mac, 4 and Glidescope Grade View: Grade III Tube type: Oral Tube size: 8.0 mm Number of attempts: 3 Airway Equipment and Method: Stylet,  Video-laryngoscopy and Bougie stylet Placement Confirmation: ETT inserted through vocal cords under direct vision,  positive ETCO2 and breath sounds checked- equal and bilateral Secured at: 22 cm Tube secured with: Tape Dental Injury: Teeth and Oropharynx as per pre-operative assessment  Difficulty Due To: Difficult Airway- due to anterior larynx and Difficult Airway- due to reduced neck mobility Future Recommendations: Recommend- induction with short-acting agent, and alternative techniques readily available Comments: Pt with poor neck mobility and anterior larynx.  1st DL able to see base of arytenoids unable to direct tube into trachea and went to esophagus, 2nd dl unable to pass bougie into glottic opening. Dl with glide scope with good visualization challenging to pass ett through glottic opening due to angle, tip of ett kept hanging up on glottic structures bougie slid into ett to facilitate passage into trachea. Slight bleeding noted in periglottic area from ett

## 2015-10-22 NOTE — Op Note (Signed)
Brief history: The patient is a 74 year old white male who has complained of back and leg pain consistent with neurogenic claudication. He has failed medical management and was worked up with a lumbar MRI. This demonstrated that the patient had lumbar spondylolisthesis, spinal stenosis, facet arthropathy, etc. I discussed the situation with the patient and his wife and reviewed the imaging studies with him. We have discussed the various treatment options including surgery. He has weighed the risks, benefits, and alternative surgery and decided to proceed with a lumbar decompression, instrumentation, and fusion.  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 Etc. requiring a wide decompression of the nerve roots.); L2-3, L3-4 and L4-5 posterior lumbar interbody fusion with local morselized autograft bone and Kinnex graft extender; insertion of interbody prosthesis at L2-3, L3-4, and L4-5 (globus peek expandable interbody prosthesis); posterior segmental instrumentation from L2-L5 with globus titanium pedicle screws and rods; posterior lateral arthrodesis at L2-3, L3-4 and L4-5 with local morselized autograft bone and Kinnex bone graft extender.  Surgeon: Dr. Delma Officer  Asst.: Dr. Shirlean Kelly  Anesthesia: Gen. endotracheal  Estimated blood loss: 500 mL  Drains: One medium Hemovac  Complications: None  Description of procedure: The patient was brought to the operating room by the anesthesia team. General endotracheal  anesthesia was induced. The patient was turned to the prone position on the Wilson frame. The patient's lumbosacral region was then prepared with Betadine scrub and Betadine solution. Sterile drapes were applied.  I then injected the area to be incised with Marcaine with epinephrine solution. I then used the scalpel to make a linear midline incision over the L2-3, L3-4 and L4-5 interspace. I then used electrocautery to perform a bilateral subperiosteal dissection exposing the spinous process and lamina of L2, L3, L4 and L5. We then obtained intraoperative radiograph to confirm our location. We then inserted the Verstrac retractor to provide exposure. I incised the interspinous ligament at L2-3, L3-4 and L4-5 with a scalpel. I removed the spinous processes of L3 and L4 with the Leksell.  I began the decompression by using the high speed drill to perform laminotomies at L2-3, L3-4 and L4-5. We then used the Kerrison punches to widen the laminotomy and removed the ligamentum flavum at L2-3, L3-4 and L4-5. We used the Kerrison punches to remove the medial facets at L2-3, L3-4 and L4-5. We performed wide foraminotomies about the bilateral L2, L3, L4 and L5 nerve roots completing the decompression.  We now turned our attention to the posterior lumbar interbody fusion. I used a scalpel to incise the intervertebral disc at L2-3, L3-4 and L4-5 bilaterally. I then performed a partial intervertebral discectomy at L2-3, L3-4 and L4-5 bilaterally using the pituitary forceps. We prepared the vertebral endplates at L2-3, L3-4, L4-5 bilaterally for the fusion by removing the soft tissues with the curettes. We then used the trial spacers to pick the appropriate sized interbody prosthesis. We prefilled his prosthesis with a combination of local morselized autograft bone that we obtained during the decompression as well as Kinnex bone graft extender. We inserted the prefilled prosthesis into the interspace at L2-3,  L3-4 and  L4-5, we expanded the prosthesis at each level. There was a good snug fit of the prosthesis in the interspace. We then filled and the remainder of the intervertebral disc space with local morselized autograft bone and Kinnex. This completed the posterior lumbar interbody arthrodesis.  We now turned attention to the instrumentation. Under fluoroscopic guidance we cannulated the bilateral L2, L3, L4 and L5 pedicles with the bone probe. We then removed the bone probe. We then tapped the pedicle with a 6.5 millimeter tap. We then removed the tap. We probed inside the tapped pedicle with a ball probe to rule out cortical breaches. We then inserted a 7.5 x 50 and 55 millimeter pedicle screw into the L2, L3, L4 and L5 pedicles bilaterally under fluoroscopic guidance. We then palpated along the medial aspect of the pedicles to rule out cortical breaches. There were none. The nerve roots were not injured. We then connected the unilateral pedicle screws with a lordotic rod. We compressed the construct and secured the rod in place with the caps. We then tightened the caps appropriately. We placed a cross connector between the rods. This completed the instrumentation from L2-L5.  We now turned our attention to the posterior lateral arthrodesis at L2-3, L3-4 and L4-5. We used the high-speed drill to decorticate the remainder of the facets, pars, transverse process at L2-3, L3-4 and L4-5. We then applied a combination of local morselized autograft bone and Kinnex bone graft extender over these decorticated posterior lateral structures. This completed the posterior lateral arthrodesis.  We then obtained hemostasis using bipolar electrocautery. We irrigated the wound out with bacitracin solution. We inspected the thecal sac and nerve roots and noted they were well decompressed. We then removed the retractor. I placed vancomycin powder in the wound. We placed a medium Hemovac drain in the epidural space and tunneled out  through separate stab wound. We reapproximated patient's thoracolumbar fascia with interrupted #1 Vicryl suture. We reapproximated patient's subcutaneous tissue with interrupted 2-0 Vicryl suture. The reapproximated patient's skin with Steri-Strips and benzoin. The wound was then coated with bacitracin ointment. A sterile dressing was applied. The drapes were removed. The patient was subsequently returned to the supine position where they were extubated by the anesthesia team. He was then transported to the post anesthesia care unit in stable condition. All sponge instrument and needle counts were reportedly correct at the end of this case.

## 2015-10-22 NOTE — Anesthesia Postprocedure Evaluation (Signed)
  Anesthesia Post-op Note  Patient: George Johnston  Procedure(s) Performed: Procedure(s) (LRB): POSTERIOR LUMBAR FUSION 3 LEVEL (N/A)  Patient Location: PACU  Anesthesia Type: General  Level of Consciousness: awake and alert   Airway and Oxygen Therapy: Patient Spontanous Breathing  Post-op Pain: mild  Post-op Assessment: Post-op Vital signs reviewed, Patient's Cardiovascular Status Stable, Respiratory Function Stable, Patent Airway and No signs of Nausea or vomiting  Last Vitals:  Filed Vitals:   10/22/15 1615  BP:   Pulse: 97  Temp:   Resp: 15    Post-op Vital Signs: stable   Complications: No apparent anesthesia complications

## 2015-10-22 NOTE — Anesthesia Preprocedure Evaluation (Signed)
Anesthesia Evaluation  Patient identified by MRN, date of birth, ID band Patient awake    Reviewed: Allergy & Precautions, NPO status , Patient's Chart, lab work & pertinent test results  Airway Mallampati: II  TM Distance: >3 FB Neck ROM: Full    Dental no notable dental hx.    Pulmonary neg pulmonary ROS,    Pulmonary exam normal breath sounds clear to auscultation       Cardiovascular hypertension, Pt. on medications Normal cardiovascular exam Rhythm:Regular Rate:Normal     Neuro/Psych negative neurological ROS  negative psych ROS   GI/Hepatic negative GI ROS, Neg liver ROS,   Endo/Other  Hypothyroidism   Renal/GU Renal InsufficiencyRenal disease  negative genitourinary   Musculoskeletal negative musculoskeletal ROS (+)   Abdominal   Peds negative pediatric ROS (+)  Hematology negative hematology ROS (+)   Anesthesia Other Findings   Reproductive/Obstetrics negative OB ROS                             Anesthesia Physical Anesthesia Plan  ASA: II  Anesthesia Plan: General   Post-op Pain Management:    Induction: Intravenous  Airway Management Planned: Oral ETT  Additional Equipment:   Intra-op Plan:   Post-operative Plan: Extubation in OR  Informed Consent: I have reviewed the patients History and Physical, chart, labs and discussed the procedure including the risks, benefits and alternatives for the proposed anesthesia with the patient or authorized representative who has indicated his/her understanding and acceptance.   Dental advisory given  Plan Discussed with: CRNA and Surgeon  Anesthesia Plan Comments:         Anesthesia Quick Evaluation

## 2015-10-23 LAB — CBC
HEMATOCRIT: 28.9 % — AB (ref 39.0–52.0)
HEMOGLOBIN: 9.5 g/dL — AB (ref 13.0–17.0)
MCH: 32.5 pg (ref 26.0–34.0)
MCHC: 32.9 g/dL (ref 30.0–36.0)
MCV: 99 fL (ref 78.0–100.0)
PLATELETS: 183 10*3/uL (ref 150–400)
RBC: 2.92 MIL/uL — AB (ref 4.22–5.81)
RDW: 15.7 % — AB (ref 11.5–15.5)
WBC: 6.2 10*3/uL (ref 4.0–10.5)

## 2015-10-23 LAB — BASIC METABOLIC PANEL
ANION GAP: 6 (ref 5–15)
BUN: 15 mg/dL (ref 6–20)
CALCIUM: 8.1 mg/dL — AB (ref 8.9–10.3)
CO2: 29 mmol/L (ref 22–32)
CREATININE: 1.12 mg/dL (ref 0.61–1.24)
Chloride: 103 mmol/L (ref 101–111)
Glucose, Bld: 130 mg/dL — ABNORMAL HIGH (ref 65–99)
Potassium: 3.8 mmol/L (ref 3.5–5.1)
Sodium: 138 mmol/L (ref 135–145)

## 2015-10-23 MED FILL — Heparin Sodium (Porcine) Inj 1000 Unit/ML: INTRAMUSCULAR | Qty: 30 | Status: AC

## 2015-10-23 MED FILL — Sodium Chloride IV Soln 0.9%: INTRAVENOUS | Qty: 2000 | Status: AC

## 2015-10-23 NOTE — Progress Notes (Signed)
Utilization review completed.  

## 2015-10-23 NOTE — Progress Notes (Signed)
Pt is transferred from [facility] to 5C12. Admission vital sign is stable. Pt is AOX4.

## 2015-10-23 NOTE — Progress Notes (Signed)
Patient ID: George Johnston, male   DOB: 1941/01/08, 74 y.o.   MRN: [id number] Subjective:  The patient is alert and pleasant. He looks great.  Objective: Vital signs in last 24 hours: Temp:  [97.7 F (36.5 C)-98.4 F (36.9 C)] 98.4 F (36.9 C) (11/11 0338) Pulse Rate:  [74-98] 74 (11/11 0338) Resp:  [11-31] 20 (11/11 0338) BP: (111-157)/(55-85) 111/55 mmHg (11/11 0338) SpO2:  [93 %-98 %] 96 % (11/11 0338)  Intake/Output from previous day: 11/10 0701 - 11/11 0700 In: 3750 [P.O.:50; I.V.:3250; Blood:200; IV Piggyback:250] Out: 2930 [Urine:1355; Drains:925; Blood:650] Intake/Output this shift:    Physical exam the patient is alert and oriented. His strength is normal and lower extremities.  Lab Results:  Recent Labs  10/23/15 0402  WBC 6.2  HGB 9.5*  HCT 28.9*  PLT 183   BMET  Recent Labs  10/23/15 0402  NA 138  K 3.8  CL 103  CO2 29  GLUCOSE 130*  BUN 15  CREATININE 1.12  CALCIUM 8.1*    Studies/Results: Dg Lumbar Spine 2-3 Views  10/22/2015  ADDENDUM REPORT: 10/22/2015 15:28 ADDENDUM: This addendum is given as the incorrect images were initially dictated under this accession number. Intraoperative localization films of lumbar spine are provided. Metallic probe is in place directed toward the L4-5 level. Electronically Signed   By: Drusilla Kanner M.D.   On: 10/22/2015 15:28  10/22/2015  CLINICAL DATA:  L2-5 laminectomy and fusion. Intraoperative fluoroscopic spot views. EXAM: LUMBAR SPINE - 2-3 VIEW; LUMBAR SPINE - 1 VIEW COMPARISON:  MRI lumbar spine 09/14/2015. FINDINGS: We are provided with 3 fluoroscopic intraoperative spot views of the lumbar spine. Images demonstrate pedicle screws and interbody spacers in place from L2-L5. No acute abnormality is identified. IMPRESSION: L2-5 PLIF in progress. Electronically Signed: By: Drusilla Kanner M.D. On: 10/22/2015 14:32   Dg Lumbar Spine 1 View  10/22/2015  ADDENDUM REPORT: 10/22/2015 15:28 ADDENDUM: This addendum  is given as the incorrect images were initially dictated under this accession number. Intraoperative localization films of lumbar spine are provided. Metallic probe is in place directed toward the L4-5 level. Electronically Signed   By: Drusilla Kanner M.D.   On: 10/22/2015 15:28  10/22/2015  CLINICAL DATA:  L2-5 laminectomy and fusion. Intraoperative fluoroscopic spot views. EXAM: LUMBAR SPINE - 2-3 VIEW; LUMBAR SPINE - 1 VIEW COMPARISON:  MRI lumbar spine 09/14/2015. FINDINGS: We are provided with 3 fluoroscopic intraoperative spot views of the lumbar spine. Images demonstrate pedicle screws and interbody spacers in place from L2-L5. No acute abnormality is identified. IMPRESSION: L2-5 PLIF in progress. Electronically Signed: By: Drusilla Kanner M.D. On: 10/22/2015 14:32   Dg C-arm 61-120 Min  10/22/2015  CLINICAL DATA:  Lumbar surgery. EXAM: DG C-ARM 61-120 MIN COMPARISON:  MRI 09/14/2015. FINDINGS: Lumbar vertebra numbered as per prior MRI. Stable L4-L5 anterolisthesis. Postsurgical changes L2 through L5 with posterior pedicle screws and inter disc fusion devices. Prior lumbar laminectomy. Three images. 0 minutes 53.1 seconds fluoro time. IMPRESSION: Postsurgical changes lumbar spine.  Stable anterolisthesis L4 on L5. Electronically Signed   By: Maisie Fus  Register   On: 10/22/2015 14:33    Assessment/Plan: Postop day #1: The patient is doing great. His strength has put on quite a bit less hemoglobin is 9.5. We'll discontinue the Hemovac drain and Foley catheter. He will likely go home on Sunday.  LOS: 1 day     Crystalina Stodghill D 10/23/2015, 7:38 AM

## 2015-10-24 LAB — CBC WITH DIFFERENTIAL/PLATELET
BASOS PCT: 1 %
Basophils Absolute: 0 10*3/uL (ref 0.0–0.1)
Eosinophils Absolute: 0.2 10*3/uL (ref 0.0–0.7)
Eosinophils Relative: 3 %
HCT: 28.6 % — ABNORMAL LOW (ref 39.0–52.0)
Hemoglobin: 9.4 g/dL — ABNORMAL LOW (ref 13.0–17.0)
LYMPHS PCT: 9 %
Lymphs Abs: 0.5 10*3/uL — ABNORMAL LOW (ref 0.7–4.0)
MCH: 33.6 pg (ref 26.0–34.0)
MCHC: 32.9 g/dL (ref 30.0–36.0)
MCV: 102.1 fL — ABNORMAL HIGH (ref 78.0–100.0)
MONO ABS: 0.4 10*3/uL (ref 0.1–1.0)
Monocytes Relative: 7 %
NEUTROS ABS: 4.9 10*3/uL (ref 1.7–7.7)
NEUTROS PCT: 80 %
PLATELETS: 177 10*3/uL (ref 150–400)
RBC: 2.8 MIL/uL — ABNORMAL LOW (ref 4.22–5.81)
RDW: 15.7 % — ABNORMAL HIGH (ref 11.5–15.5)
WBC: 6 10*3/uL (ref 4.0–10.5)

## 2015-10-24 NOTE — Progress Notes (Signed)
Physical Therapy Treatment Patient Details Name: George Johnston MRN: [id number] DOB: 26-Oct-1941 Today's Date: 10/24/2015    History of Present Illness 74 y.o. male s/p L3-4 laminectomy and PLIF L2-5. PMH: asthma, chronic kidney disease, bilateral TKA.    PT Comments    Pt progressing towards all goals. V/c's for adherence to back precautions,. Acute PT to con't to follow to progress indep with mobility.  Follow Up Recommendations  Home health PT;Supervision for mobility/OOB     Equipment Recommendations  Rolling walker with 5" wheels    Recommendations for Other Services       Precautions / Restrictions Precautions Precautions: Fall;Back Precaution Booklet Issued: Yes (comment) Precaution Comments: reviewed back precautions with patient and spouse Required Braces or Orthoses: Spinal Brace Spinal Brace: Lumbar corset;Applied in sitting position Restrictions Weight Bearing Restrictions: No    Mobility  Bed Mobility Overal bed mobility: Needs Assistance Bed Mobility: Rolling;Sidelying to Sit;Sit to Sidelying Rolling: Supervision Sidelying to sit: Supervision     Sit to sidelying: Supervision General bed mobility comments: cues and min assist with LEs  Transfers Overall transfer level: Needs assistance Equipment used: Rolling walker (2 wheeled) Transfers: Sit to/from Stand Sit to Stand: Min guard         General transfer comment: v/c's for hand placement  Ambulation/Gait Ambulation/Gait assistance: Min guard Ambulation Distance (Feet): 150 Feet Assistive device: Rolling walker (2 wheeled) Gait Pattern/deviations: Step-through pattern;Decreased stride length Gait velocity: decreased   General Gait Details: increased UE wbing,    Stairs            Wheelchair Mobility    Modified Rankin (Stroke Patients Only)       Balance Overall balance assessment: Needs assistance         Standing balance support: No upper extremity supported Standing  balance-Leahy Scale: Fair Standing balance comment: able to stand and urinate without difficulty                    Cognition Arousal/Alertness: Awake/alert Behavior During Therapy: WFL for tasks assessed/performed Overall Cognitive Status: Within Functional Limits for tasks assessed                      Exercises      General Comments        Pertinent Vitals/Pain Pain Assessment: 0-10 Pain Score: 6  Pain Location: back Pain Descriptors / Indicators: Aching Pain Intervention(s): Monitored during session    Home Living                      Prior Function            PT Goals (current goals can now be found in the care plan section) Acute Rehab PT Goals Patient Stated Goal: go home Progress towards PT goals: Progressing toward goals    Frequency  Min 5X/week    PT Plan Current plan remains appropriate    Co-evaluation             End of Session Equipment Utilized During Treatment: Gait belt;Back brace Activity Tolerance: Patient tolerated treatment well Patient left: in bed;with call bell/phone within reach;with family/visitor present;with SCD's reapplied     Time: 1335-1404 PT Time Calculation (min) (ACUTE ONLY): 29 min  Charges:  $Gait Training: 8-22 mins $Therapeutic Activity: 8-22 mins                    G Codes:      George Johnston  George Johnston 10/24/2015, 2:31 PM  Lewis Shock, PT, DPT Pager #: [phone] Office #: [phone]

## 2015-10-24 NOTE — Progress Notes (Signed)
Patient ID: George Johnston, male   DOB: 31-Jul-1941, 74 y.o.   MRN: [id number] Seems to be doing very well. Has appropriate back soreness. No leg pain. No numbness tingling or weakness. Would like to be discharged tomorrow.

## 2015-10-25 MED ORDER — OXYCODONE-ACETAMINOPHEN 5-325 MG PO TABS: 1.0000 | ORAL_TABLET | ORAL | Status: DC | PRN

## 2015-10-25 MED ORDER — DIAZEPAM 5 MG PO TABS: 5.0000 mg | ORAL_TABLET | Freq: Four times a day (QID) | ORAL | Status: DC | PRN

## 2015-10-25 NOTE — Progress Notes (Signed)
Physical Therapy Treatment Patient Details Name: George Johnston MRN: [id number] DOB: 1941/09/24 Today's Date: 10/25/2015    History of Present Illness 74 y.o. male s/p L3-4 laminectomy and PLIF L2-5. PMH: asthma, chronic kidney disease, bilateral TKA.    PT Comments    Pt progressing well towards all goals. Pt remains to have L hip pain. Pt given handout on precautions as patient with poor recall.  Follow Up Recommendations  Home health PT;Supervision for mobility/OOB     Equipment Recommendations  Rolling walker with 5" wheels    Recommendations for Other Services       Precautions / Restrictions Precautions Precautions: Fall;Back Precaution Booklet Issued: Yes (comment) Precaution Comments: reviewed back precautions with patient and spouse Required Braces or Orthoses: Spinal Brace Spinal Brace: Lumbar corset;Applied in sitting position Restrictions Weight Bearing Restrictions: No    Mobility  Bed Mobility Overal bed mobility: Modified Independent Bed Mobility: Rolling;Sidelying to Sit Rolling: Modified independent (Device/Increase time) Sidelying to sit: Modified independent (Device/Increase time)       General bed mobility comments: pt demo'd good technique  Transfers Overall transfer level: Needs assistance Equipment used: Rolling walker (2 wheeled) Transfers: Sit to/from Stand Sit to Stand: Min guard         General transfer comment: v/c's for hand placement and to minimize bending at hips  Ambulation/Gait Ambulation/Gait assistance: Supervision Ambulation Distance (Feet): 200 Feet Assistive device: Rolling walker (2 wheeled) Gait Pattern/deviations: Step-to pattern Gait velocity: decreased   General Gait Details: increased UE wbing,    Stairs Stairs: Yes Stairs assistance: Min assist Stair Management: Two rails;Step to pattern Number of Stairs: 2 General stair comments: v/c's for up with the good, down with the bad, slow guarded  Wheelchair  Mobility    Modified Rankin (Stroke Patients Only)       Balance                                    Cognition Arousal/Alertness: Awake/alert Behavior During Therapy: WFL for tasks assessed/performed Overall Cognitive Status: Within Functional Limits for tasks assessed                      Exercises      General Comments General comments (skin integrity, edema, etc.): spoke extensively regarding back precautions due to pt's poor recall      Pertinent Vitals/Pain Pain Assessment: 0-10 Pain Score: 5  Pain Location: back/L hip Pain Descriptors / Indicators: Aching Pain Intervention(s): Monitored during session    Home Living                      Prior Function            PT Goals (current goals can now be found in the care plan section) Progress towards PT goals: Progressing toward goals    Frequency  Min 5X/week    PT Plan Current plan remains appropriate    Co-evaluation             End of Session Equipment Utilized During Treatment: Gait belt;Back brace Activity Tolerance: Patient tolerated treatment well Patient left:  (sitting EOB)     Time: 1700-1749 PT Time Calculation (min) (ACUTE ONLY): 23 min  Charges:  $Gait Training: 8-22 mins $Therapeutic Activity: 8-22 mins                    G Codes:  Marcene Brawn 10/25/2015, 3:09 PM  Lewis Shock, PT, DPT Pager #: [phone] Office #: [phone]

## 2015-10-25 NOTE — Discharge Summary (Signed)
Physician Discharge Summary  Patient ID: George Johnston MRN: [id number] DOB/AGE: 1941/10/28 74 y.o.  Admit date: 10/22/2015 Discharge date: 10/25/2015  Admission Diagnoses:L2-3, L3-4 and L4-5 spondylolisthesis, Degenerative disc disease, spinal stenosis compressing L2, L3, L4 and L5 nerve roots; lumbago; lumbar radiculopathy  Discharge Diagnoses: L2-3, L3-4 and L4-5 spondylolisthesis, Degenerative disc disease, spinal stenosis compressing L2, L3, L4 and L5 nerve roots; lumbago; lumbar radiculopathy  Active Problems:   Spondylolisthesis of lumbar region   Discharged Condition: good  Hospital Course: Mr. Mccauley was admitted to the hospital for neurogenic claudication due to spondylolisthesis. He was taken to the operating room for decompression of the neural elements and subsequent fusion. He is doing well at discharge, ambulating, voiding, and tolerating a regular diet. His wound is clean, dry, and without signs of infection at discharge.  Treatments: surgery: L3 and L4 laminectomy with bilateral L2 Laminotomy/foraminotomies to decompress the bilateral L2, L3, L4 and L5 nerve roots(the work required to do this was in addition to the work required to do the posterior lumbar interbody fusion because of the patient's spinal stenosis, facet arthropathy. Etc. requiring a wide decompression of the nerve roots.); L2-3, L3-4 and L4-5 posterior lumbar interbody fusion with local morselized autograft bone and Kinnex graft extender; insertion of interbody prosthesis at L2-3, L3-4, and L4-5 (globus peek expandable interbody prosthesis); posterior segmental instrumentation from L2-L5 with globus titanium pedicle screws and rods; posterior lateral arthrodesis at L2-3, L3-4 and L4-5 with local morselized autograft bone and Kinnex bone graft extender.  Surgeon: Dr. Delma Officer   Discharge Exam: Blood pressure 168/87, pulse 79, temperature 98.5 F (36.9 C), temperature source Oral, resp. rate 20, height 5\' 9"   (1.753 m), weight 86.728 kg (191 lb 3.2 oz), SpO2 97 %. General appearance: alert, cooperative, appears stated age and no distress Neurologic: Motor: moving all extremities well. normal muscle tone and bulk.  Disposition:  lumbar spondylolisthesis    Medication List    TAKE these medications        ADVIL 200 MG Caps  Generic drug:  Ibuprofen  Take 400 mg by mouth 4 (four) times daily as needed (pain).     atorvastatin 10 MG tablet  Commonly known as:  LIPITOR  Take 10 mg by mouth 3 (three) times a week.     diazepam 5 MG tablet  Commonly known as:  VALIUM  Take 1 tablet (5 mg total) by mouth every 6 (six) hours as needed for muscle spasms.     folic acid 1 MG tablet  Commonly known as:  FOLVITE  Take 1 mg by mouth daily.     HYDROcodone-acetaminophen 10-325 MG tablet  Commonly known as:  NORCO  Take 1 tablet by mouth every 6 (six) hours as needed (pain).     levothyroxine 125 MCG tablet  Commonly known as:  SYNTHROID, LEVOTHROID  Take 125 mcg by mouth daily before breakfast.     methotrexate 2.5 MG tablet  Commonly known as:  RHEUMATREX  Take 10 mg by mouth 2 (two) times a week. Caution:Chemotherapy. Protect from light.     omeprazole 40 MG capsule  Commonly known as:  PRILOSEC  Take 40 mg by mouth daily as needed (acid reflux).     oxyCODONE-acetaminophen 5-325 MG tablet  Commonly known as:  PERCOCET/ROXICET  Take 1-2 tablets by mouth every 4 (four) hours as needed for moderate pain.     Saw Palmetto 450 MG Caps  Take 900 mg by mouth daily.     tadalafil  5 MG tablet  Commonly known as:  CIALIS  Take 5 mg by mouth daily.     tamsulosin 0.4 MG Caps capsule  Commonly known as:  FLOMAX  Take 0.4 mg by mouth daily.           Follow-up Information    Follow up with Cristi Loron, MD In 3 weeks.   Specialty:  Neurosurgery   Why:  call the office to make an appointment   Contact information:   [address] [place]  [address] [phone]       Signed: Carmela Hurt 10/25/2015, 11:16 AM

## 2015-10-25 NOTE — Progress Notes (Signed)
Pt discharged at this time with wife taking all personal belongings. Brace on and aligned. IV discontinued, dry dressing applied. Discharge instructions and prescriptions provided with verbal understanding. Pt notified to follow up per discharge instructions. No noted distress.

## 2017-05-14 IMAGING — MR MR LUMBAR SPINE W/O CM
4 of 5 series · 25 of 48 positions shown · non-contrast
Comparison: Radiographs dated 08/07/2015, CT scan abdomen on
07/15/2015 and lumbar MRI dated 11/23/2011

CLINICAL DATA: Chronic increasing low back pain and bilateral hip
pain.

EXAM:
MRI LUMBAR SPINE WITHOUT CONTRAST
TECHNIQUE: Multiplanar, multisequence MR imaging of the lumbar spine was
performed. No intravenous contrast was administered.

[Series 4: T2 · sagittal · 4.0mm · 0.55mm/px · 5 of 12 slices shown (1 of 2)]
[im 1/12]
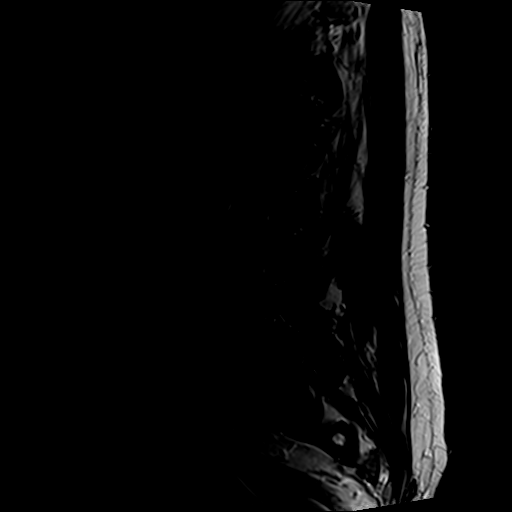
[im 3/12]
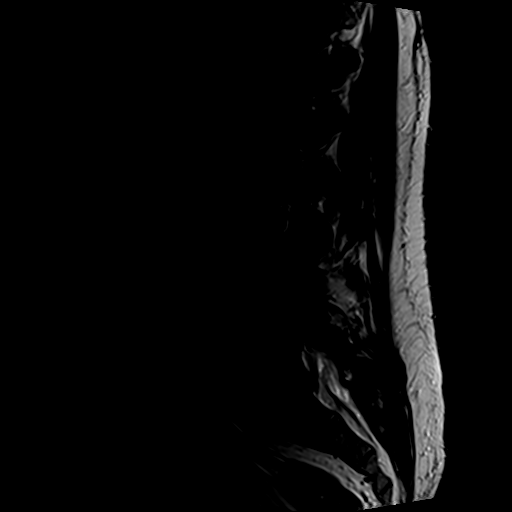
[im 6/12]
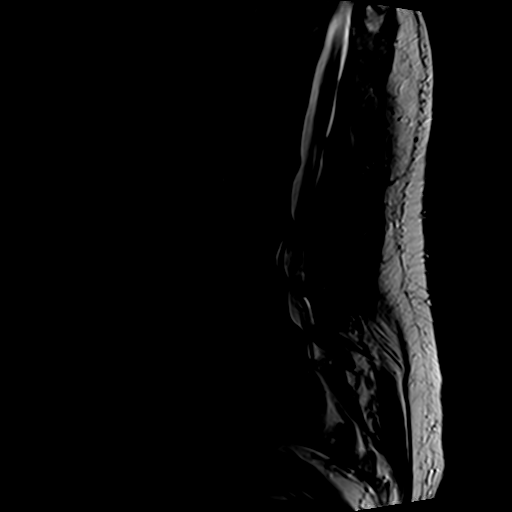
[im 9/12]
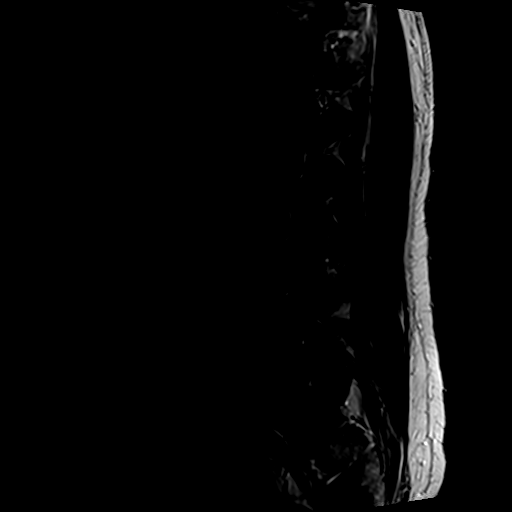
[im 12/12]
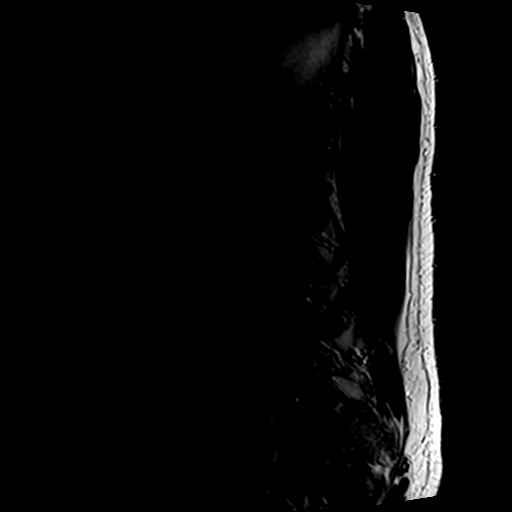

[Series 6: T1 · sagittal · 4.0mm · 0.55mm/px · 7 of 14 slices shown (1 of 2)]
[im 1/14]
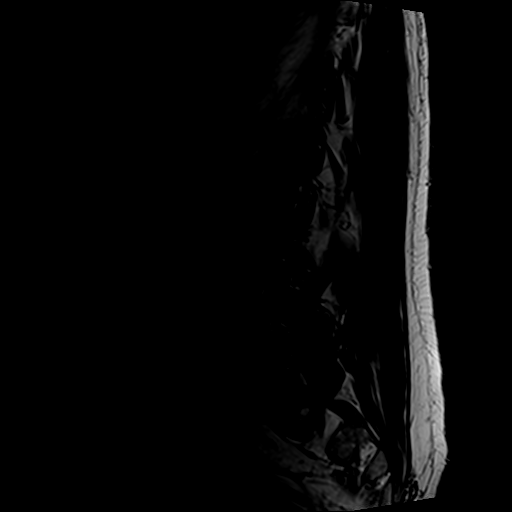
[im 3/14]
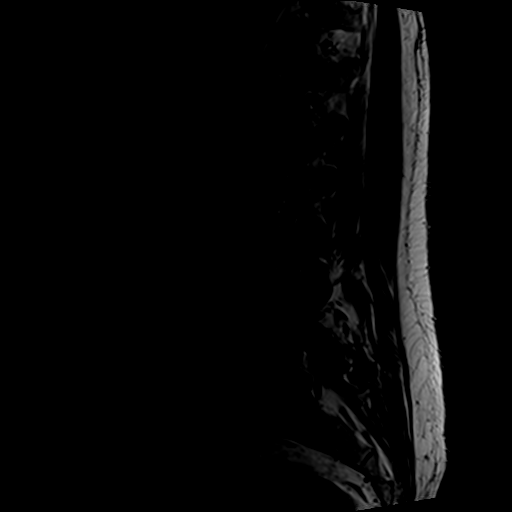
[im 5/14]
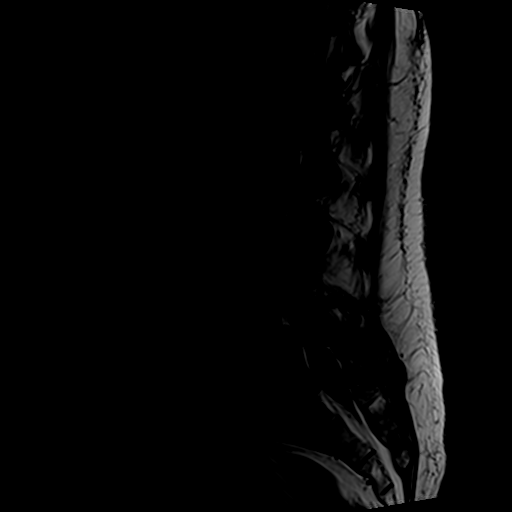
[im 7/14]
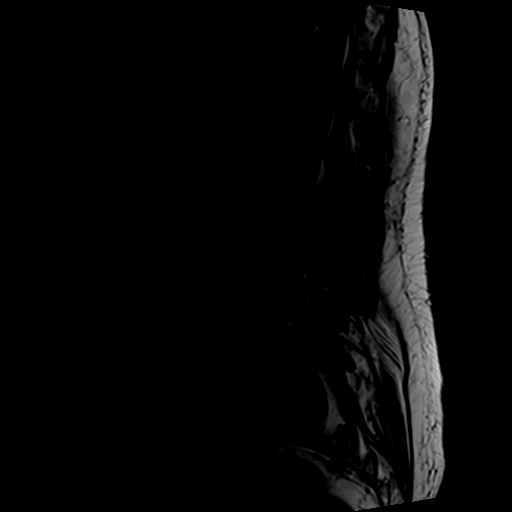
[im 9/14]
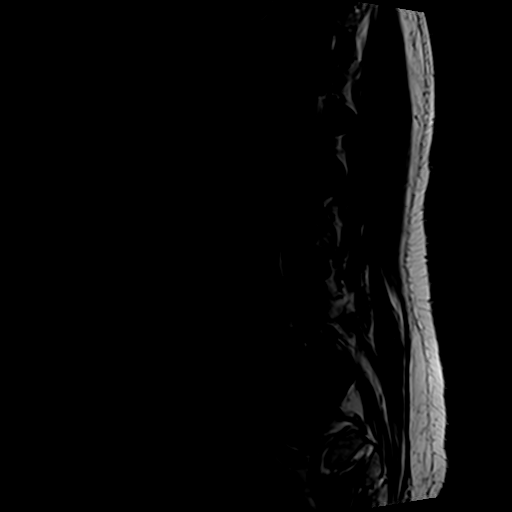
[im 11/14]
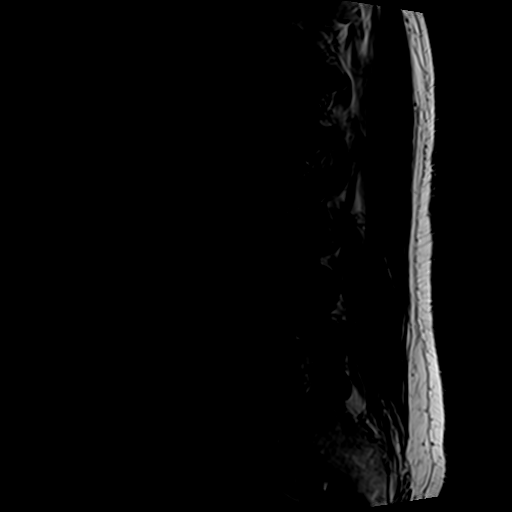
[im 14/14]
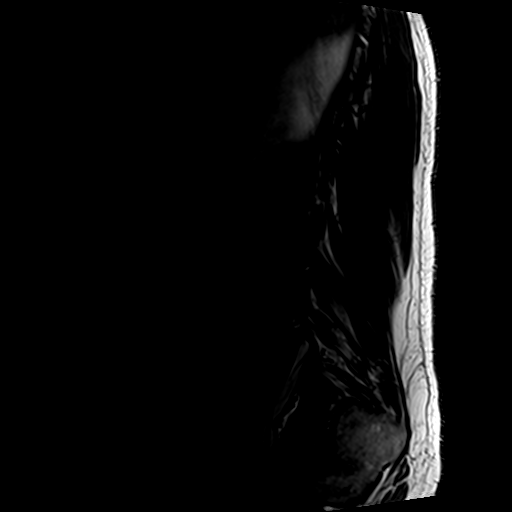

[Series 7: T2 · axial · 4.0mm · 0.70mm/px · z∈[-46,+124]mm · 9 of 30 slices shown (2 of 2)]
[im 1/30]
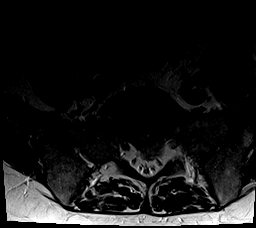
[im 5/30]
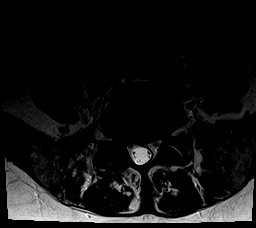
[im 9/30]
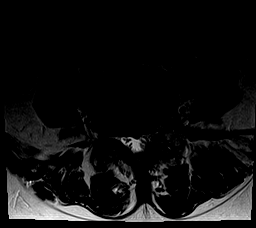
[im 13/30]
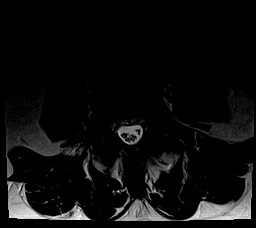
[im 15/30]
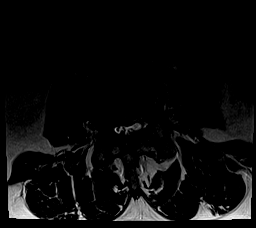
[im 17/30]
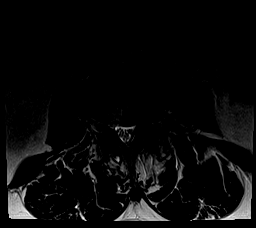
[im 21/30]
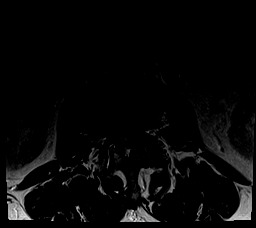
[im 25/30]
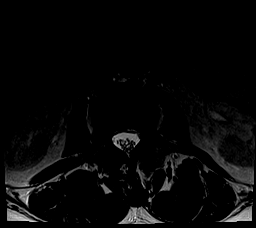
[im 30/30]
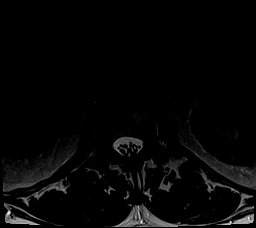

[Series 8: T1 · axial · 4.0mm · 0.35mm/px · z∈[-46,+98]mm · 4 of 30 slices shown (2 of 2)]
[im 1/30]
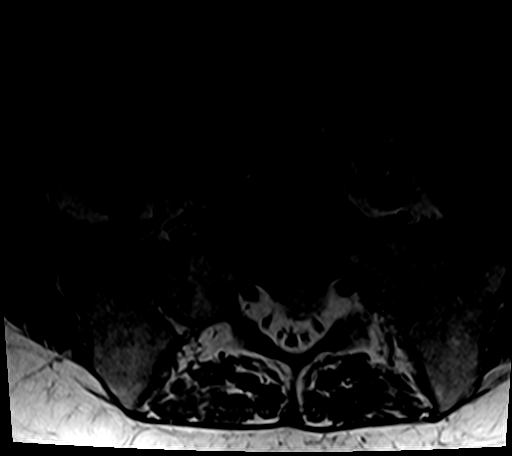
[im 5/30]
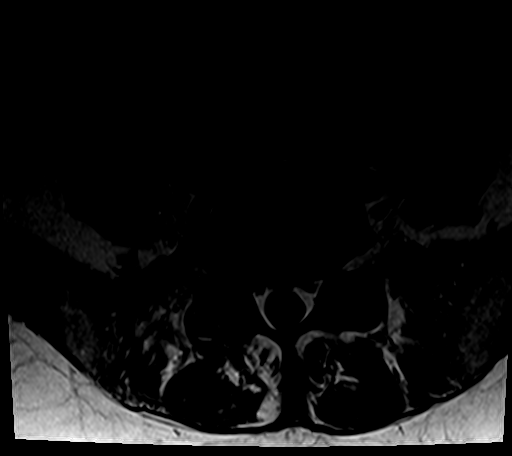
[im 15/30]
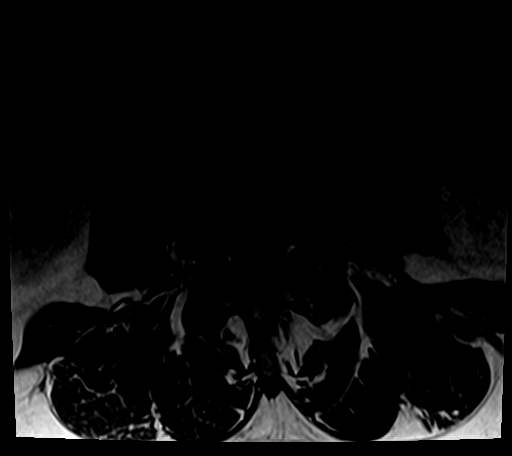
[im 25/30]
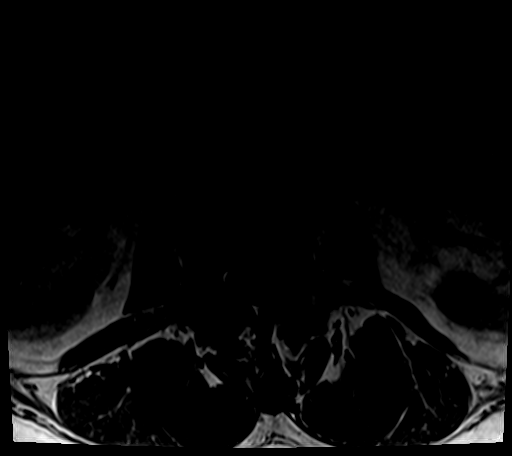

[25 of 48 positions shown; findings below may reference images not displayed]

FINDINGS: Normal conus tip at L1-2.  Normal paraspinal soft tissues.

T11-12 and T12-L1: Normal discs. Benign hemangiomata in the T12 and
L1 vertebra.

L1-2: Small disc protrusion to the left of midline and small disc
bulge to the right of midline. No focal neural impingement.

L2-3: Prominent soft disc protrusion central and to the left with
slight extension across the midline to the right. Hypertrophy of the
ligamentum flavum and facet joints. Marked compression of the thecal
sac particularly on the left. Right lateral recess is also
compressed. This should affect both L3 nerves.

L3-4: 2 mm spondylolisthesis with disc space narrowing and
degenerative changes of the endplates with a small broad-based disc
bulge with small protrusions into the neural foramina bilaterally.
Moderate left foraminal stenosis without focal neural impingement.

Marked hypertrophy of the left ligamentum flavum with bilateral
facet arthritis. The thecal sac is compressed, particularly the left
lateral recess which should affect the left L4 nerve.

L4-5: 7 mm spondylolisthesis. Small broad-based disc bulge
asymmetric to the right with moderate bilateral foraminal stenosis,
left greater than right. Severe bilateral facet arthritis with
ligamentum flavum hypertrophy with marked compression of the thecal
sac and severe impingement of both lateral recesses which should
affect both L5 nerves.

L5-S1: 3 mm retrolisthesis of L5 on S1 with a small broad-based disc
bulge with no neural impingement. Slight bilateral facet arthritis.
Severe right foraminal stenosis which could affect the right L5
nerve.
IMPRESSION: 1. Marked compression of the thecal sac and lateral recesses at L2-3
due to a soft disc protrusion and hypertrophy of the posterior
elements.
2. Marked compression of the thecal sac and of the left lateral
recess at L3-4.

3. Severe spinal stenosis and bilateral lateral recess compression
at L4-5 with grade 1 spondylolisthesis and severe facet arthritis.
4. Severe right foraminal stenosis at L5-S1 which could affect the
right L5 nerve.

## 2019-04-10 ENCOUNTER — Other Ambulatory Visit (HOSPITAL_COMMUNITY): Payer: Self-pay | Admitting: Internal Medicine

## 2019-04-10 DIAGNOSIS — I517 Cardiomegaly: Secondary | ICD-10-CM

## 2019-04-12 ENCOUNTER — Other Ambulatory Visit: Payer: Self-pay

## 2019-04-12 ENCOUNTER — Ambulatory Visit (HOSPITAL_COMMUNITY)
Admission: RE | Admit: 2019-04-12 | Discharge: 2019-04-12 | Disposition: A | Discharge status: Home / Self Care | Payer: Medicare Other | Source: Ambulatory Visit | Attending: Internal Medicine | Admitting: Internal Medicine

## 2019-04-12 DIAGNOSIS — I10 Essential (primary) hypertension: Secondary | ICD-10-CM | POA: Diagnosis not present

## 2019-04-12 DIAGNOSIS — I517 Cardiomegaly: Secondary | ICD-10-CM | POA: Diagnosis present

## 2019-04-12 NOTE — Progress Notes (Signed)
2D Echocardiogram has been performed.  George Johnston 04/12/2019, 2:33 PM

## 2020-01-07 ENCOUNTER — Ambulatory Visit: Payer: Medicare Other

## 2020-01-16 ENCOUNTER — Ambulatory Visit: Payer: Medicare Other

## 2020-01-18 ENCOUNTER — Ambulatory Visit: Payer: Medicare Other | Attending: Internal Medicine

## 2020-01-18 DIAGNOSIS — Z23 Encounter for immunization: Secondary | ICD-10-CM

## 2020-01-18 NOTE — Progress Notes (Signed)
   Covid-19 Vaccination Clinic  Name:  George Johnston    MRN: [id number] DOB: 05/22/1941  01/18/2020  George Johnston was observed post Covid-19 immunization for 15 minutes without incidence. He was provided with Vaccine Information Sheet and instruction to access the V-Safe system.   George Johnston was instructed to call 911 with any severe reactions post vaccine: Marland Kitchen Difficulty breathing  . Swelling of your face and throat  . A fast heartbeat  . A bad rash all over your body  . Dizziness and weakness    Immunizations Administered    Name Date Dose VIS Date Route   Pfizer COVID-19 Vaccine 01/18/2020  4:52 PM 0.3 mL 11/22/2019 Intramuscular   Manufacturer: [employer], [employer]   Lot: [id number]   NDC: [address]-0

## 2020-02-12 ENCOUNTER — Ambulatory Visit: Payer: Medicare Other | Attending: Internal Medicine

## 2020-02-12 DIAGNOSIS — Z23 Encounter for immunization: Secondary | ICD-10-CM

## 2020-02-12 NOTE — Progress Notes (Signed)
.  covidobs 

## 2020-09-09 DIAGNOSIS — M069 Rheumatoid arthritis, unspecified: Secondary | ICD-10-CM | POA: Insufficient documentation

## 2021-07-06 ENCOUNTER — Ambulatory Visit
Admission: RE | Admit: 2021-07-06 | Discharge: 2021-07-06 | Disposition: A | Discharge status: Home / Self Care | Payer: Medicare Other | Source: Ambulatory Visit | Attending: Internal Medicine | Admitting: Internal Medicine

## 2021-07-06 ENCOUNTER — Other Ambulatory Visit: Payer: Self-pay | Admitting: Internal Medicine

## 2021-07-06 DIAGNOSIS — R059 Cough, unspecified: Secondary | ICD-10-CM

## 2021-07-22 ENCOUNTER — Other Ambulatory Visit: Payer: Self-pay

## 2021-07-22 ENCOUNTER — Encounter (HOSPITAL_COMMUNITY): Payer: Self-pay

## 2021-07-22 ENCOUNTER — Emergency Department (HOSPITAL_COMMUNITY): Payer: Medicare Other

## 2021-07-22 ENCOUNTER — Emergency Department (HOSPITAL_COMMUNITY)
Admission: EM | Admit: 2021-07-22 | Discharge: 2021-07-22 | Disposition: A | Discharge status: Home / Self Care | Payer: Medicare Other | Attending: Emergency Medicine | Admitting: Emergency Medicine

## 2021-07-22 DIAGNOSIS — M549 Dorsalgia, unspecified: Secondary | ICD-10-CM | POA: Diagnosis not present

## 2021-07-22 DIAGNOSIS — R809 Proteinuria, unspecified: Secondary | ICD-10-CM | POA: Diagnosis not present

## 2021-07-22 DIAGNOSIS — J45909 Unspecified asthma, uncomplicated: Secondary | ICD-10-CM | POA: Diagnosis not present

## 2021-07-22 DIAGNOSIS — E039 Hypothyroidism, unspecified: Secondary | ICD-10-CM | POA: Diagnosis not present

## 2021-07-22 DIAGNOSIS — R110 Nausea: Secondary | ICD-10-CM | POA: Insufficient documentation

## 2021-07-22 DIAGNOSIS — Z96653 Presence of artificial knee joint, bilateral: Secondary | ICD-10-CM | POA: Insufficient documentation

## 2021-07-22 DIAGNOSIS — I129 Hypertensive chronic kidney disease with stage 1 through stage 4 chronic kidney disease, or unspecified chronic kidney disease: Secondary | ICD-10-CM | POA: Insufficient documentation

## 2021-07-22 DIAGNOSIS — Z79899 Other long term (current) drug therapy: Secondary | ICD-10-CM | POA: Diagnosis not present

## 2021-07-22 DIAGNOSIS — N189 Chronic kidney disease, unspecified: Secondary | ICD-10-CM | POA: Insufficient documentation

## 2021-07-22 DIAGNOSIS — R109 Unspecified abdominal pain: Secondary | ICD-10-CM | POA: Diagnosis present

## 2021-07-22 LAB — COMPREHENSIVE METABOLIC PANEL
ALT: 33 U/L (ref 0–44)
AST: 37 U/L (ref 15–41)
Albumin: 3.9 g/dL (ref 3.5–5.0)
Alkaline Phosphatase: 75 U/L (ref 38–126)
Anion gap: 12 (ref 5–15)
BUN: 20 mg/dL (ref 8–23)
CO2: 27 mmol/L (ref 22–32)
Calcium: 9.2 mg/dL (ref 8.9–10.3)
Chloride: 97 mmol/L — ABNORMAL LOW (ref 98–111)
Creatinine, Ser: 1.09 mg/dL (ref 0.61–1.24)
GFR, Estimated: >60 mL/min (ref >60)
Glucose, Bld: 106 mg/dL — ABNORMAL HIGH (ref 70–99)
Potassium: 4 mmol/L (ref 3.5–5.1)
Sodium: 136 mmol/L (ref 135–145)
Total Bilirubin: 1.3 mg/dL — ABNORMAL HIGH (ref 0.3–1.2)
Total Protein: 6.7 g/dL (ref 6.5–8.1)

## 2021-07-22 LAB — URINALYSIS, ROUTINE W REFLEX MICROSCOPIC
Bacteria, UA: NONE SEEN
Bilirubin Urine: NEGATIVE
Glucose, UA: NEGATIVE mg/dL
Hgb urine dipstick: NEGATIVE
Ketones, ur: 80 mg/dL — AB
Leukocytes,Ua: NEGATIVE
Nitrite: NEGATIVE
Protein, ur: >=300 mg/dL — AB
Specific Gravity, Urine: 1.027 (ref 1.005–1.030)
pH: 5 (ref 5.0–8.0)

## 2021-07-22 LAB — CBC WITH DIFFERENTIAL/PLATELET
Abs Immature Granulocytes: 0.05 10*3/uL (ref 0.00–0.07)
Basophils Absolute: 0.1 10*3/uL (ref 0.0–0.1)
Basophils Relative: 1 %
Eosinophils Absolute: 0 10*3/uL (ref 0.0–0.5)
Eosinophils Relative: 0 %
HCT: 45.9 % (ref 39.0–52.0)
Hemoglobin: 15.4 g/dL (ref 13.0–17.0)
Immature Granulocytes: 1 %
Lymphocytes Relative: 4 %
Lymphs Abs: 0.4 10*3/uL — ABNORMAL LOW (ref 0.7–4.0)
MCH: 32 pg (ref 26.0–34.0)
MCHC: 33.6 g/dL (ref 30.0–36.0)
MCV: 95.4 fL (ref 80.0–100.0)
Monocytes Absolute: 0.7 10*3/uL (ref 0.1–1.0)
Monocytes Relative: 7 %
Neutro Abs: 8.7 10*3/uL — ABNORMAL HIGH (ref 1.7–7.7)
Neutrophils Relative %: 87 %
Platelets: 219 10*3/uL (ref 150–400)
RBC: 4.81 MIL/uL (ref 4.22–5.81)
RDW: 11.9 % (ref 11.5–15.5)
WBC: 9.9 10*3/uL (ref 4.0–10.5)
nRBC: 0 % (ref 0.0–0.2)

## 2021-07-22 MED ORDER — HYDROMORPHONE HCL 2 MG PO TABS: 2.0000 mg | ORAL_TABLET | Freq: Every day | ORAL | 0 refills | Status: AC | PRN

## 2021-07-22 MED ORDER — HYDROMORPHONE HCL 1 MG/ML IJ SOLN
1.0000 mg | Freq: Once | INTRAMUSCULAR | Status: AC
  Administered 2021-07-22: 1 mg via INTRAVENOUS
  Filled 2021-07-22: qty 1

## 2021-07-22 MED ORDER — FENTANYL CITRATE (PF) 100 MCG/2ML IJ SOLN
50.0000 ug | Freq: Once | INTRAMUSCULAR | Status: AC
  Administered 2021-07-22: 50 ug via INTRAVENOUS
  Filled 2021-07-22: qty 2

## 2021-07-22 MED ORDER — ONDANSETRON HCL 4 MG/2ML IJ SOLN
4.0000 mg | Freq: Once | INTRAMUSCULAR | Status: AC
  Administered 2021-07-22: 4 mg via INTRAVENOUS
  Filled 2021-07-22: qty 2

## 2021-07-22 NOTE — ED Provider Notes (Signed)
[facility]EMERGENCY DEPT Provider Note   CSN: [id number] Arrival date & time: 07/22/21  2671     History Chief Complaint  Patient presents with   Back Pain    George Johnston is a 80 y.o. male.  He has a history of kidney stone x1.  This spontaneously passed and did not require intervention.  He also has a history of musculoskeletal back pain and is status post surgery.  This pain feels very different than his back pain.  He has had some nausea.  No dysuria.  He has urinary frequency at baseline, and this has not changed.  No hematuria.  The history is provided by the patient.  Flank Pain This is a new problem. The current episode started 2 days ago. The problem occurs constantly. The problem has not changed since onset.Associated symptoms include abdominal pain (left testicle). Pertinent negatives include no chest pain, no headaches and no shortness of breath. Nothing aggravates the symptoms. Nothing relieves the symptoms. Treatments tried: hydrocodone, NSAIDs. The treatment provided no relief.      Past Medical History:  Diagnosis Date   Arthritis    Asthma    as a child   Chronic kidney disease    GERD (gastroesophageal reflux disease)    Hypertension    Hypothyroidism    Kidney stone    PONV (postoperative nausea and vomiting)     Patient Active Problem List   Diagnosis Date Noted   Spondylolisthesis of lumbar region 10/22/2015    Past Surgical History:  Procedure Laterality Date   JOINT REPLACEMENT  2002 & 2003   both knees   TONSILLECTOMY         History reviewed. No pertinent family history.  Social History   Tobacco Use   Smoking status: Never  Substance Use Topics   Alcohol use: Yes    Comment: 7 drinks a week   Drug use: No    Home Medications Prior to Admission medications   Medication Sig Start Date End Date Taking? Authorizing Provider  atorvastatin (LIPITOR) 10 MG tablet Take 10 mg by mouth 3 (three) times a week.     [provider]  diazepam (VALIUM) 5 MG tablet Take 1 tablet (5 mg total) by mouth every 6 (six) hours as needed for muscle spasms. 10/25/15   Coletta Memos, MD  folic acid (FOLVITE) 1 MG tablet Take 1 mg by mouth daily.    [provider]  HYDROcodone-acetaminophen (NORCO) 10-325 MG tablet Take 1 tablet by mouth every 6 (six) hours as needed (pain).    [provider]  Ibuprofen (ADVIL) 200 MG CAPS Take 400 mg by mouth 4 (four) times daily as needed (pain).    [provider]  levothyroxine (SYNTHROID, LEVOTHROID) 125 MCG tablet Take 125 mcg by mouth daily before breakfast.    [provider]  methotrexate (RHEUMATREX) 2.5 MG tablet Take 10 mg by mouth 2 (two) times a week. Caution:Chemotherapy. Protect from light.    [provider]  omeprazole (PRILOSEC) 40 MG capsule Take 40 mg by mouth daily as needed (acid reflux).    [provider]  oxyCODONE-acetaminophen (PERCOCET/ROXICET) 5-325 MG tablet Take 1-2 tablets by mouth every 4 (four) hours as needed for moderate pain. 10/25/15   Coletta Memos, MD  Saw Palmetto 450 MG CAPS Take 900 mg by mouth daily.    [provider]  tadalafil (CIALIS) 5 MG tablet Take 5 mg by mouth daily.    [provider]  tamsulosin (FLOMAX) 0.4 MG CAPS capsule Take 0.4 mg by mouth daily.    [provider]    Allergies    Erythromycin and Erythromycin base  Review of Systems   Review of Systems  Constitutional:  Negative for chills and fever.  HENT:  Negative for ear pain and sore throat.   Eyes:  Negative for pain and visual disturbance.  Respiratory:  Negative for cough and shortness of breath.   Cardiovascular:  Negative for chest pain and palpitations.  Gastrointestinal:  Positive for abdominal pain (left testicle) and nausea. Negative for constipation, diarrhea and vomiting.  Genitourinary:  Positive for flank pain, frequency and testicular pain. Negative for difficulty  urinating, dysuria, hematuria, scrotal swelling and urgency.  Musculoskeletal:  Negative for arthralgias and back pain.  Skin:  Negative for color change and rash.  Neurological:  Negative for seizures, syncope and headaches.  All other systems reviewed and are negative.  Physical Exam Updated Vital Signs BP (!) 163/98   Pulse 98   Temp 98 F (36.7 C)   Resp 20   Ht  (1.753 m)   Wt 80.3 kg   SpO2 99%   BMI 26.14 kg/m   Physical Exam Vitals and nursing note reviewed.  Constitutional:      Appearance: Normal appearance.  HENT:     Head: Normocephalic and atraumatic.  Eyes:     Conjunctiva/sclera: Conjunctivae normal.  Pulmonary:     Effort: Pulmonary effort is normal. No respiratory distress.  Abdominal:     General: There is no distension.     Tenderness: There is no abdominal tenderness. There is left CVA tenderness.  Musculoskeletal:        General: No deformity. Normal range of motion.     Cervical back: Normal range of motion.  Skin:    General: Skin is warm and dry.  Neurological:     General: No focal deficit present.     Mental Status: He is alert and oriented to person, place, and time. Mental status is at baseline.  Psychiatric:        Mood and Affect: Mood normal.    ED Results / Procedures / Treatments   Labs (all labs ordered are listed, but only abnormal results are displayed) Labs Reviewed  CBC WITH DIFFERENTIAL/PLATELET - Abnormal; Notable for the following components:      Result Value   Neutro Abs 8.7 (*)    Lymphs Abs 0.4 (*)    All other components within normal limits  COMPREHENSIVE METABOLIC PANEL  URINALYSIS, ROUTINE W REFLEX MICROSCOPIC    EKG None  Radiology CT Renal Stone Study  Result Date: 07/22/2021 CLINICAL DATA:  Left lower back and testicle pain EXAM: CT ABDOMEN AND PELVIS WITHOUT CONTRAST TECHNIQUE: Multidetector CT imaging of the abdomen and pelvis was performed following the standard protocol without IV contrast.  COMPARISON:  07/15/2015. FINDINGS: Lower chest: No acute abnormality. Redemonstrated calcified coronary arteries. Hepatobiliary: Evaluation of the abdominal organs is limited in the absence of intravenous contrast. Within this limitation, the liver and gallbladder are normal in contour. Pancreas: Unremarkable. No pancreatic ductal dilatation or surrounding inflammatory changes. Spleen: Normal in size without focal abnormality. Adrenals/Urinary Tract: The adrenal glands are unremarkable. No hydronephrosis. Unchanged punctate nonobstructing stone in the inferior right kidney, as seen on the prior CT. No new nephrolithiasis is seen. Hypoattenuating lesion in the mid right kidney is too small to characterize but appears unchanged from the prior exam. No stone is seen  in the course of the ureters. The bladder is unremarkable. Stomach/Bowel: Stomach is within normal limits. Appendix appears normal. No evidence of bowel wall thickening, distention, or inflammatory changes. Diverticulosis without evidence of diverticulitis. Vascular/Lymphatic: The abdominal aorta is normal in caliber with mild atherosclerotic calcifications. No lymphadenopathy. Reproductive: Prostate is unremarkable. Other: No free air in the abdomen or pelvis. Musculoskeletal: Status post interval posterior fusion and decompression L2-L5, with interbody disc spacers. Unchanged grade 1 anterolisthesis L4 on L5. Additional multilevel degenerative changes are noted in the imaged spine. IMPRESSION: 1. No new urolithiasis to explain the patient's left flank pain. 2. Unchanged appearance of a nonobstructing stone in the inferior right kidney. Electronically Signed   By: Wiliam Ke MD   On: 07/22/2021 12:27    Procedures Procedures   Medications Ordered in ED Medications  fentaNYL (SUBLIMAZE) injection 50 mcg (50 mcg Intravenous Given 07/22/21 1022)  ondansetron (ZOFRAN) injection 4 mg (4 mg Intravenous Given 07/22/21 1021)    ED Course  I have  reviewed the triage vital signs and the nursing notes.  Pertinent labs & imaging results that were available during my care of the patient were reviewed by me and considered in my medical decision making (see chart for details).    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  He also has lumbar spondylosis.  He presents with severe left-sided back pain that feels atypical to his usual, daily musculoskeletal back pain.  ED evaluation was undertaken to ascertain whether or not he had an intra-abdominal or pelvic source of his issues.  CT scan revealed no acute findings to explain his symptoms.  Urinalysis revealed mild ketones and some protein.  However, his overall renal function was fairly normal.  I talked to him at length because I was unable to determine the exact source of his pain.  However, the CT scan was reassuring with respect to serious pathology, and we did discuss the fact that his aorta is normal caliber, and there is no bowel inflammation or obstruction.  My best guess at this point is that it is musculoskeletal.  He was given Dilaudid to use very sparingly and only for severe pain.  He does have hydrocodone at home, and he understands that he should not take 2 narcotics together.  He is scheduled for acupuncture treatment starting in a few days, and I encouraged this as well as physical therapy.  He was encouraged to return if symptoms persist or worsen.  Otherwise, he should follow-up with his primary care doctor and/or his spine specialist. Final Clinical Impression(s) / ED Diagnoses Final diagnoses:  Flank pain  Proteinuria, unspecified type    Rx / DC Orders ED Discharge Orders          Ordered    HYDROmorphone (DILAUDID) 2 MG tablet  Daily PRN       Note to Pharmacy: He will not use hydrocodone if he requires Dilaudid. We discussed the dangers of this combination.   07/22/21 1348              Koleen Distance, MD 07/22/21 1354

## 2021-07-22 NOTE — ED Triage Notes (Addendum)
Left lower back pain and left testicle pain constant x2 days with nausea. Went to urgent care and was refereed to ED to r/o kidney stone. Patient denies hematuria.

## 2021-07-30 ENCOUNTER — Other Ambulatory Visit: Payer: Self-pay | Admitting: Neurosurgery

## 2021-07-30 DIAGNOSIS — M48062 Spinal stenosis, lumbar region with neurogenic claudication: Secondary | ICD-10-CM

## 2021-07-30 DIAGNOSIS — R1032 Left lower quadrant pain: Secondary | ICD-10-CM

## 2021-08-14 ENCOUNTER — Ambulatory Visit
Admission: RE | Admit: 2021-08-14 | Discharge: 2021-08-14 | Disposition: A | Discharge status: Home / Self Care | Payer: Medicare Other | Source: Ambulatory Visit | Attending: Neurosurgery | Admitting: Neurosurgery

## 2021-08-14 DIAGNOSIS — R1032 Left lower quadrant pain: Secondary | ICD-10-CM

## 2021-08-14 DIAGNOSIS — M48062 Spinal stenosis, lumbar region with neurogenic claudication: Secondary | ICD-10-CM

## 2021-08-14 MED ORDER — GADOBENATE DIMEGLUMINE 529 MG/ML IV SOLN
14.0000 mL | Freq: Once | INTRAVENOUS | Status: AC | PRN
  Administered 2021-08-14: 14 mL via INTRAVENOUS

## 2022-04-07 ENCOUNTER — Other Ambulatory Visit: Payer: Self-pay | Admitting: Neurosurgery

## 2022-04-07 DIAGNOSIS — G8929 Other chronic pain: Secondary | ICD-10-CM

## 2022-04-20 ENCOUNTER — Ambulatory Visit
Admission: RE | Admit: 2022-04-20 | Discharge: 2022-04-20 | Disposition: A | Discharge status: Home / Self Care | Payer: Medicare Other | Source: Ambulatory Visit | Attending: Neurosurgery | Admitting: Neurosurgery

## 2022-04-20 DIAGNOSIS — G8929 Other chronic pain: Secondary | ICD-10-CM

## 2022-04-20 MED ORDER — MEPERIDINE HCL 50 MG/ML IJ SOLN: 50.0000 mg | Freq: Once | INTRAMUSCULAR | Status: DC | PRN

## 2022-04-20 MED ORDER — ONDANSETRON HCL 4 MG/2ML IJ SOLN: 4.0000 mg | Freq: Once | INTRAMUSCULAR | Status: DC | PRN

## 2022-04-20 MED ORDER — IOPAMIDOL (ISOVUE-M 200) INJECTION 41%
18.0000 mL | Freq: Once | INTRAMUSCULAR | Status: AC
  Administered 2022-04-20: 18 mL via INTRATHECAL

## 2022-04-20 MED ORDER — DIAZEPAM 5 MG PO TABS
5.0000 mg | ORAL_TABLET | Freq: Once | ORAL | Status: AC
  Administered 2022-04-20: 5 mg via ORAL

## 2022-04-20 NOTE — Discharge Instructions (Signed)

## 2022-05-17 ENCOUNTER — Other Ambulatory Visit: Payer: Self-pay | Admitting: Internal Medicine

## 2022-05-17 DIAGNOSIS — R1010 Upper abdominal pain, unspecified: Secondary | ICD-10-CM

## 2022-05-24 ENCOUNTER — Ambulatory Visit
Admission: RE | Admit: 2022-05-24 | Discharge: 2022-05-24 | Disposition: A | Discharge status: Home / Self Care | Payer: Medicare Other | Source: Ambulatory Visit | Attending: Internal Medicine | Admitting: Internal Medicine

## 2022-05-24 DIAGNOSIS — R1010 Upper abdominal pain, unspecified: Secondary | ICD-10-CM

## 2022-06-09 ENCOUNTER — Other Ambulatory Visit: Payer: Self-pay | Admitting: Neurosurgery

## 2022-07-11 NOTE — Pre-Procedure Instructions (Signed)
Surgical Instructions    Your procedure is scheduled on Wednesday 04/19/22.   Report to [facility] Main Entrance "A" at 10:45 A.M., then check in with the Admitting office.  Call this number if you have problems the morning of surgery:  [phone]   If you have any questions prior to your surgery date call [phone]: Open Monday-Friday 8am-4pm    Remember:  Do not eat or drink after midnight the night before your surgery     Take these medicines the morning of surgery with A SIP OF WATER:   HYDROcodone-acetaminophen (NORCO)  levothyroxine (SYNTHROID)   methocarbamol (ROBAXIN)  predniSONE (DELTASONE)  rosuvastatin (CRESTOR)  sertraline (ZOLOFT)   tamsulosin (FLOMAX)    Take these medicines if needed:   carboxymethylcellulose (REFRESH PLUS)  omeprazole (PRILOSEC)  promethazine (PHENERGAN)   Please follow your surgeon's instructions regarding leflunomide (ARAVA). If you have not received instructions, please contact your surgeon's office for instructions.   As of today, STOP taking any Aspirin (unless otherwise instructed by your surgeon) Aleve, Naproxen, Ibuprofen, Motrin, Advil, Goody's, BC's, all herbal medications, fish oil, and all vitamins.           Do not wear jewelry or makeup Do not wear lotions, powders, perfumes/colognes, or deodorant. Do not shave 48 hours prior to surgery.  Men may shave face and neck. Do not bring valuables to the hospital. Do not wear nail polish, gel polish, artificial nails, or any other type of covering on natural nails (fingers and toes) If you have artificial nails or gel coating that need to be removed by a nail salon, please have this removed prior to surgery. Artificial nails or gel coating may interfere with anesthesia's ability to adequately monitor your vital signs.  [facility] is not responsible for any belongings or valuables. .   Do NOT Smoke (Tobacco/Vaping)  24 hours prior to your procedure  If you use a CPAP at night,  you may bring your mask for your overnight stay.   Contacts, glasses, hearing aids, dentures or partials may not be worn into surgery, please bring cases for these belongings   For patients admitted to the hospital, discharge time will be determined by your treatment team.   Patients discharged the day of surgery will not be allowed to drive home, and someone needs to stay with them for 24 hours.   SURGICAL WAITING ROOM VISITATION Patients having surgery or a procedure may have no more than 2 support people in the waiting area - these visitors may rotate.   Children under the age of 65 must have an adult with them who is not the patient. If the patient needs to stay at the hospital during part of their recovery, the visitor guidelines for inpatient rooms apply. Pre-op nurse will coordinate an appropriate time for 1 support person to accompany patient in pre-op.  This support person may not rotate.   Please refer to the [facility] website for the visitor guidelines for Inpatients (after your surgery is over and you are in a regular room).    Special instructions:    Oral Hygiene is also important to reduce your risk of infection.  Remember - BRUSH YOUR TEETH THE MORNING OF SURGERY WITH YOUR REGULAR TOOTHPASTE   [facility]- Preparing For Surgery  Before surgery, you can play an important role. Because skin is not sterile, your skin needs to be as free of germs as possible. You can reduce the number of germs on your skin by washing with  CHG (chlorahexidine gluconate) Soap before surgery.  CHG is an antiseptic cleaner which kills germs and bonds with the skin to continue killing germs even after washing.     Please do not use if you have an allergy to CHG or antibacterial soaps. If your skin becomes reddened/irritated stop using the CHG.  Do not shave (including legs and underarms) for at least 48 hours prior to first CHG shower. It is OK to shave your face.  Please follow these  instructions carefully.     Shower the NIGHT BEFORE SURGERY and the MORNING OF SURGERY with CHG Soap.   If you chose to wash your hair, wash your hair first as usual with your normal shampoo. After you shampoo, rinse your hair and body thoroughly to remove the shampoo.  Then [employer] and genitals (private parts) with your normal soap and rinse thoroughly to remove soap.  After that Use CHG Soap as you would any other liquid soap. You can apply CHG directly to the skin and wash gently with a scrungie or a clean washcloth.   Apply the CHG Soap to your body ONLY FROM THE NECK DOWN.  Do not use on open wounds or open sores. Avoid contact with your eyes, ears, mouth and genitals (private parts). Wash Face and genitals (private parts)  with your normal soap.   Wash thoroughly, paying special attention to the area where your surgery will be performed.  Thoroughly rinse your body with warm water from the neck down.  DO NOT shower/wash with your normal soap after using and rinsing off the CHG Soap.  Pat yourself dry with a CLEAN TOWEL.  Wear CLEAN PAJAMAS to bed the night before surgery  Place CLEAN SHEETS on your bed the night before your surgery  DO NOT SLEEP WITH PETS.   Day of Surgery:  Take a shower with CHG soap. Wear Clean/Comfortable clothing the morning of surgery Do not apply any deodorants/lotions.   Remember to brush your teeth WITH YOUR REGULAR TOOTHPASTE.    If you received a COVID test during your pre-op visit, it is requested that you wear a mask when out in public, stay away from anyone that may not be feeling well, and notify your surgeon if you develop symptoms. If you have been in contact with anyone that has tested positive in the last 10 days, please notify your surgeon.    Please read over the following fact sheets that you were given.

## 2022-07-12 ENCOUNTER — Other Ambulatory Visit: Payer: Self-pay

## 2022-07-12 ENCOUNTER — Encounter (HOSPITAL_COMMUNITY): Payer: Self-pay

## 2022-07-12 ENCOUNTER — Encounter (HOSPITAL_COMMUNITY)
Admission: RE | Admit: 2022-07-12 | Discharge: 2022-07-12 | Disposition: A | Discharge status: Home / Self Care | Payer: Medicare Other | Source: Ambulatory Visit | Attending: Neurosurgery | Admitting: Neurosurgery

## 2022-07-12 DIAGNOSIS — K219 Gastro-esophageal reflux disease without esophagitis: Secondary | ICD-10-CM | POA: Insufficient documentation

## 2022-07-12 DIAGNOSIS — I10 Essential (primary) hypertension: Secondary | ICD-10-CM | POA: Diagnosis not present

## 2022-07-12 DIAGNOSIS — M4316 Spondylolisthesis, lumbar region: Secondary | ICD-10-CM | POA: Diagnosis not present

## 2022-07-12 DIAGNOSIS — Z01818 Encounter for other preprocedural examination: Secondary | ICD-10-CM

## 2022-07-12 DIAGNOSIS — M069 Rheumatoid arthritis, unspecified: Secondary | ICD-10-CM | POA: Insufficient documentation

## 2022-07-12 DIAGNOSIS — M48062 Spinal stenosis, lumbar region with neurogenic claudication: Secondary | ICD-10-CM | POA: Insufficient documentation

## 2022-07-12 DIAGNOSIS — Z01812 Encounter for preprocedural laboratory examination: Secondary | ICD-10-CM | POA: Diagnosis not present

## 2022-07-12 DIAGNOSIS — E039 Hypothyroidism, unspecified: Secondary | ICD-10-CM | POA: Diagnosis not present

## 2022-07-12 HISTORY — DX: Rheumatoid arthritis, unspecified: M06.9

## 2022-07-12 HISTORY — DX: Personal history of urinary calculi: Z87.442

## 2022-07-12 LAB — CBC
HCT: 30.7 % — ABNORMAL LOW (ref 39.0–52.0)
Hemoglobin: 9.5 g/dL — ABNORMAL LOW (ref 13.0–17.0)
MCH: 32.3 pg (ref 26.0–34.0)
MCHC: 30.9 g/dL (ref 30.0–36.0)
MCV: 104.4 fL — ABNORMAL HIGH (ref 80.0–100.0)
Platelets: 432 10*3/uL — ABNORMAL HIGH (ref 150–400)
RBC: 2.94 MIL/uL — ABNORMAL LOW (ref 4.22–5.81)
RDW: 15.9 % — ABNORMAL HIGH (ref 11.5–15.5)
WBC: 5.6 10*3/uL (ref 4.0–10.5)
nRBC: 0 % (ref 0.0–0.2)

## 2022-07-12 LAB — BASIC METABOLIC PANEL
Anion gap: 5 (ref 5–15)
BUN: 17 mg/dL (ref 8–23)
CO2: 30 mmol/L (ref 22–32)
Calcium: 8.9 mg/dL (ref 8.9–10.3)
Chloride: 105 mmol/L (ref 98–111)
Creatinine, Ser: 1.23 mg/dL (ref 0.61–1.24)
GFR, Estimated: 60 mL/min — ABNORMAL LOW (ref >60)
Glucose, Bld: 103 mg/dL — ABNORMAL HIGH (ref 70–99)
Potassium: 3.8 mmol/L (ref 3.5–5.1)
Sodium: 140 mmol/L (ref 135–145)

## 2022-07-12 LAB — TYPE AND SCREEN
ABO/RH(D): A POS
Antibody Screen: NEGATIVE

## 2022-07-12 LAB — SURGICAL PCR SCREEN
MRSA, PCR: NEGATIVE
Staphylococcus aureus: POSITIVE — AB

## 2022-07-12 NOTE — Progress Notes (Addendum)
PCP - Dr. Burton Apley  Cardiologist - no  EP-no  Endocrine-  Pulm-  Chest x-ray -   EKG -   Stress Test -  na  ECHO - 04/12/2019  Cardiac Cath - no  Sleep Study - no CPAP - no  LABS- CBC, BMP, T/S, PCR  ASA-na  ERAS-no  HA1C-na Fasting Blood Sugar - na Checks Blood Sugar __0___ times a day  Anesthesia-  Pt denies having chest pain, sob, or fever at this time. All instructions explained to the pt, with a verbal understanding of the material. Pt agrees to go over the instructions while at home for a better understanding. Pt also instructed to self quarantine after being tested for COVID-19. The opportunity to ask questions was provided.

## 2022-07-12 NOTE — Pre-Procedure Instructions (Signed)
Surgical Instructions    Your procedure is scheduled on Wednesday 04/19/22.   Report to [facility] Main Entrance "A" at 10:45 A.M., then check in with the Admitting office.  Call this number if you have problems the morning of surgery:  [phone] this is the pre- surgery desk.   If you have any questions prior to your surgery date call [phone]: Open Monday-Friday 8am-4pm PAT Desk    Remember:  Do not eat or drink after midnight the night before your surgery   Take these medicines the morning of surgery with A SIP OF WATER:   HYDROcodone-acetaminophen (NORCO)  levothyroxine (SYNTHROID)   methocarbamol (ROBAXIN)  predniSONE (DELTASONE)  rosuvastatin (CRESTOR)  sertraline (ZOLOFT)   tamsulosin (FLOMAX)    Take these medicines if needed:   carboxymethylcellulose (REFRESH PLUS)  omeprazole (PRILOSEC)  promethazine (PHENERGAN)   Please follow your surgeon's instructions regarding leflunomide (ARAVA). If you have not received instructions, please contact your surgeon's office for instructions.   As of today, STOP taking any Aspirin (unless otherwise instructed by your surgeon) Aleve, Naproxen, Ibuprofen, Motrin, Advil, Goody's, BC's, all herbal medications, fish oil, and all vitamins.          The morning of surgery: Do not wear jewelry or makeup Do not wear lotions, powders, perfumes/colognes, or deodorant. Men may shave face and neck. Do not bring valuables to the hospital. Do not wear nail polish, gel polish, artificial nails, or any other type of covering on natural nails (fingers and toes) If you have artificial nails or gel coating that need to be removed by a nail salon, please have this removed prior to surgery. Artificial nails or gel coating may interfere with anesthesia's ability to adequately monitor your vital signs.  [facility] is not responsible for any belongings or valuables. .   Do NOT Smoke (Tobacco/Vaping)  24 hours prior to your procedure  If you use  a CPAP at night, you may bring your mask for your overnight stay.   Contacts, glasses, hearing aids, dentures or partials may not be worn into surgery, please bring cases for these belongings   For patients admitted to the hospital, discharge time will be determined by your treatment team.   Patients discharged the day of surgery will not be allowed to drive home, and someone needs to stay with them for 24 hours.  SURGICAL WAITING ROOM VISITATION  Children under the age of 86 must have an adult with them who is not the patient. If the patient needs to stay at the hospital during part of their recovery, the visitor guidelines for inpatient rooms apply. Pre-op nurse will coordinate an appropriate time for 1 support person to accompany patient in pre-op.  This support person may not rotate.   Please refer to the [facility] website for the visitor guidelines for Inpatients (after your surgery is over and you are in a regular room).   Special instructions:    Oral Hygiene is also important to reduce your risk of infection.  Remember - BRUSH YOUR TEETH THE MORNING OF SURGERY WITH YOUR REGULAR TOOTHPASTE   [place]- Preparing For Surgery  Before surgery, you can play an important role. Because skin is not sterile, your skin needs to be as free of germs as possible. You can reduce the number of germs on your skin by washing with CHG (chlorahexidine gluconate) Soap before surgery.  CHG is an antiseptic cleaner which kills germs and bonds with the skin to continue killing germs even after  washing.     Please do not use if you have an allergy to CHG or antibacterial soaps. If your skin becomes reddened/irritated stop using the CHG.  Do not shave (including legs and underarms) for at least 48 hours prior to first CHG shower. It is OK to shave your face.  Please follow these instructions carefully.    Shower the NIGHT BEFORE SURGERY and the MORNING OF SURGERY with CHG Soap.   If you chose to  wash your hair, wash your hair first as usual with your normal shampoo. After you shampoo, rinse your hair and body thoroughly to remove the shampoo.  Then [employer] and genitals (private parts) with your normal soap and rinse thoroughly to remove soap.  After that Use CHG Soap as you would any other liquid soap. You can apply CHG directly to the skin and wash gently with a scrungie or a clean washcloth.   Apply the CHG Soap to your body ONLY FROM THE NECK DOWN.  Do not use on open wounds or open sores. Avoid contact with your eyes, ears, mouth and genitals (private parts). Wash Face and genitals (private parts)  with your normal soap.   Wash thoroughly, paying special attention to the area where your surgery will be performed.  Thoroughly rinse your body with warm water from the neck down.  DO NOT shower/wash with your normal soap after using and rinsing off the CHG Soap.  Pat yourself dry with a CLEAN TOWEL.  Wear CLEAN PAJAMAS to bed the night before surgery  Place CLEAN SHEETS on your bed the night before your surgery  DO NOT SLEEP WITH PETS.   Day of Surgery:  Take a shower with CHG soap. Wear Clean/Comfortable clothing the morning of surgery Do not apply any deodorants/lotions.   Remember to brush your teeth WITH YOUR REGULAR TOOTHPASTE.  If you received a COVID test during your pre-op visit, it is requested that you wear a mask when out in public, stay away from anyone that may not be feeling well, and notify your surgeon if you develop symptoms. If you have been in contact with anyone that has tested positive in the last 10 days, please notify your surgeon.  Please read over the following fact sheets that you were given.

## 2022-07-13 ENCOUNTER — Encounter (HOSPITAL_COMMUNITY): Payer: Self-pay

## 2022-07-13 ENCOUNTER — Other Ambulatory Visit (HOSPITAL_COMMUNITY): Payer: Medicare Other

## 2022-07-13 NOTE — Anesthesia Preprocedure Evaluation (Addendum)
Anesthesia Evaluation  Patient identified by MRN, date of birth, ID band Patient awake    Reviewed: Allergy & Precautions, NPO status , Patient's Chart, lab work & pertinent test results  History of Anesthesia Complications (+) PONV and history of anesthetic complications  Airway Mallampati: II  TM Distance: >3 FB Neck ROM: Full    Dental  (+) Chipped,    Pulmonary neg pulmonary ROS,    Pulmonary exam normal breath sounds clear to auscultation       Cardiovascular hypertension, Pt. on medications Normal cardiovascular exam Rhythm:Regular     Neuro/Psych negative neurological ROS  negative psych ROS   GI/Hepatic Neg liver ROS, GERD  Medicated and Controlled,  Endo/Other  Hypothyroidism Hyperlipidemia  Renal/GU negative Renal ROS  negative genitourinary   Musculoskeletal  (+) Arthritis , Osteoarthritis,  Neurogenic claudication Lumbar stenosis   Abdominal   Peds  Hematology negative hematology ROS (+)   Anesthesia Other Findings   Reproductive/Obstetrics                            Anesthesia Physical Anesthesia Plan  ASA: 2  Anesthesia Plan: General   Post-op Pain Management:    Induction: Intravenous  PONV Risk Score and Plan: 4 or greater and Treatment may vary due to age or medical condition, Scopolamine patch - Pre-op, Ondansetron and Dexamethasone  Airway Management Planned: Oral ETT  Additional Equipment: None  Intra-op Plan:   Post-operative Plan: Extubation in OR  Informed Consent: I have reviewed the patients History and Physical, chart, labs and discussed the procedure including the risks, benefits and alternatives for the proposed anesthesia with the patient or authorized representative who has indicated his/her understanding and acceptance.     Dental advisory given  Plan Discussed with:   Anesthesia Plan Comments: (PAT note written by Shonna Chock, PA-C.  On daily prednisone 5 mg for RA.   He wants anesthesiologist to consider Zofran and scopolamine patch for post-operative N/V prophylaxis.   )       Anesthesia Quick Evaluation

## 2022-07-13 NOTE — Progress Notes (Addendum)
Anesthesia Chart Review:  Case: [id number] Date/Time: 07/20/22 1232   Procedure: PLIF,IP,POSTERIOR INSTRUMENTATION L12;EXPLORE FUSION - 3C   Anesthesia type: General   Pre-op diagnosis: LUMBAR STENOSIS WITH NEUROGENIC CLAUDICATION   Location: [facility] ROOM 21 / [facility] OR   Surgeons: Tressie Stalker, MD       DISCUSSION: Patient is a 81 year old male scheduled for the above procedure.  History includes never smoker, post-operative N/V, HTN, hypothyroidism, GERD, RA, osteoarthritis (left TKA 10/21/02; right TKA 10/20/03), nephrolithiasis, spinal surgery (L2-5 PLIF 10/22/15). Alcohol intake is documented as 7 standard drinks/week.   Preoperative labs showed H/H 9.5/30.7, PLT 432. WBC 5.6. Reviewed on 06/28/22 (CE): WBC 2.7. H/H 9.0/27.9, PLT 218, AST 19, ALT 12 Reviewed on 03/29/22 (CE): WBC 4.6. H/H 13.0/39.1, PLT 274, AST 27, ALT 10 Reviewed on 10/15/21 (CE): H/H 13.3/41.3. WBC 6.0. 07/22/21:  H/H 15.4/45.9, PLT 219. WBC 9.9.   I called and spoke with him given new anemia since April 2023. He reported being on methotrexate for about 5 years, but stopped within the past two years due to mouth sores. He was started on Arava, but due to cost asked to try methotrexate again less than a year ago. He developed mouth sores again and labs in July 2023 showed a drop in his WBC (4.6->2.7), and HGB (13.0->9.0). He was told to stop methotrexate about two weeks ago. His mouth sores have resolved. His understanding is that the CBC changes were drug-inducted (methotrexate), so he is hopeful because his numbers were rising (WBC 5.6, HGB 9.5) with his PAT labs. He denied chest pain, SOB, syncope. He says that up until July of 2022, he was playing golf 3x/week. He still tries to be active and is able to go get groceries, care for his disables wife, go up 1-2 flights of stairs but that he has debilitating lower back pain and thigh heaviness multiple times throughout the day. He uses a heating pad and had been alternating  acetaminophen, ibuprofen, Vicodin for pain control. Over the past year he has went from 165 lb to 140 lb. He has had a poor appetite and feels like a "bubble" is in his stomach at times. Occasional low grade nausea without vomiting. He denied dysphagia. No hematochezia. Says he had a colonoscopy per Dr. Matthias Hughs within the past 5 years and was released. Dr. Su Hilt also ordered a CT scan of the abdominal pain in June that showed no acute abnormality of the abdomen, 1 mm non-obstructing right renal stone, stable 4 mm LLL nodule (no follow-up if patient considered low risk). He feels his back pain is why he has a decreased appetite. He is on daily prednisone for RA. Ranae Plumber was recently resumed but he admits to not consistently taking yet and has been holding it for surgery. He feels his RA is well controlled.    Mr. Roye says he had a physical with Dr. Su Hilt in the Spring 2023. Reportedly EKG stable. HGB had been around 13 then, but now in the 9 range since July. Rheumatologist Dr. Dierdre Forth is repeating labs on 07/18/22, reportedly to evaluate for improving trends of CBC since methotrexate stopped. Mr. Mccauley is scheduled to see Dr. Su Hilt that same day. Dr. Su Hilt is aware of the poor appetite with weight loss with some testing as above. Records from Dr. Su Hilt is still pending, but in the interim, I've asked him to make sure Dr. Su Hilt is aware that lumbar surgery is planned. Nikki at Dr. Lovell Sheehan' office is also reaching out to Dr.  Rohnan regarding presurgical input. He says his back pain is taking a big toll on him mentally and physically, and again he thinks his weight and appetite will improve after surgery; however, if he does have surgery and symptoms persist then I encouraged him to follow-up with GI in the near future.  Mr. Goeller says he has issues with post-operative N/V and requests Zofran and scopolamine patch. Discussed use of scopolamine patch will be up to the anesthesiologist after consideration of  his age and co-morbidities.  Chart will be left for follow-up.  ADDENDUM 07/19/22 4:28 PM: Last office note and EKG received from Dr. Su Hilt. 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 Last office visit was on 07/18/22. He signed a surgical clearance letter on 07/19/22 classifying Mr. Revard as "Low Risk."  I also received copy of 07/18/22 labs from [facility] that showed: WBC 7.3 and H/H 11.0/34.9 (copy on shadow chart). Blood counts continue to improve following stopping methotrexate.   Anesthesia team to evaluate on the day of surgery.   VS: BP 117/71   Pulse 100   Temp 36.8 C   Resp 17   Ht 5\' 9"  (1.753 m)   Wt 65 kg   SpO2 99%   BMI 21.16 kg/m    PROVIDERS: , MD is PCP  Burton Apley, MD is rheumatologist Alben Deeds, MD is GI       LABS:  07/12/22 labs noted. See DISCUSSION. (all labs ordered are listed, but only abnormal results are displayed)  Labs Reviewed  SURGICAL PCR SCREEN - Abnormal; Notable for the following components:      Result Value   Staphylococcus aureus POSITIVE (*)    All other components within normal limits  BASIC METABOLIC PANEL - Abnormal; Notable for the following components:   Glucose, Bld 103 (*)    GFR, Estimated 60 (*)    All other components within normal limits  CBC - Abnormal; Notable for the following components:   RBC 2.94 (*)    Hemoglobin 9.5 (*)    HCT 30.7 (*)    MCV 104.4 (*)    RDW 15.9 (*)    Platelets 432 (*)    All other components within normal limits  TYPE AND SCREEN   - UPDATE:  WBC 7.3 and H/H 11.0/34.9 on 07/18/22 at [facility].   IMAGES: CT Abd 05/24/22: IMPRESSION: 1. No acute abnormality identified in the abdomen. 2. 1 mm nonobstructing stone identified in the lower pole right kidney. 3. 4 mm nodule identified in the left lower lobe unchanged compared to prior exam of 2022. No follow-up needed if patient is low-risk.This recommendation follows the consensus  statement: Guidelines for Management of Incidental Pulmonary Nodules Detected on CT Images: From the Fleischner Society 2017; Radiology 2017; 284:228-243. - Aortic Atherosclerosis (ICD10-I70.0).  CT L-spine/Myelogram 04/20/22: IMPRESSION: 1. Solid L2-L5 fusion without stenosis. 2. 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 3. Moderate to severe right and severe left neural foraminal stenosis at L5-S1. 4. Aortic Atherosclerosis (ICD10-I70.0).    EKG: EKG requested from Dr. 06/20/22 on 07/19/22. EKG 05/03/22 (done per Dr. 05/05/22): SR. Possible LAE. Right BBB. LAFB. LVH with repolarization abnormality. Anterolateral infarct.  - RBBB with likely secondary T wave changes have been present since at least 10/18/02 ([facility]). LAD is new. Copies of 03/23/15 EKG (Dr. 05/23/15), and 10/18/02 and 10/17/03 EKGs ([facility]) are on his shadow chart.    EKG 03/23/15: sinus rhythm. RBBB. Possible lateral infarct,  age undetermined. Appears stable when compared to prior EKG 10/17/03.    CV: Echo 04/12/19 (ordered by PCP Lorene Dy, MD, indication: "LVH"): IMPRESSIONS   1. The left ventricle has normal systolic function with an ejection  fraction of 60-65%. The cavity size was normal. Left ventricular diastolic  Doppler parameters are indeterminate.   2. The right ventricle has normal systolic function. The cavity was  normal. There is no increase in right ventricular wall thickness.   3. The mitral valve is grossly normal.   4. The aortic valve is abnormal. Mild thickening of the aortic valve.  Mild calcification of the aortic valve. Aortic valve regurgitation is  trivial by color flow Doppler.   He denied prior stress test or cardiac cath.   Past Medical History:  Diagnosis Date   Arthritis    GERD (gastroesophageal reflux disease)    History of kidney stones    Hypertension    Hypothyroidism    PONV (postoperative nausea and vomiting)     Does not have it, if given Zofran and a patch   RA (rheumatoid arthritis) ([place])     Past Surgical History:  Procedure Laterality Date   JOINT REPLACEMENT  2002 & 2003   both knees   TONSILLECTOMY      MEDICATIONS:  carboxymethylcellulose (REFRESH PLUS) 0.5 % SOLN   chlorthalidone (HYGROTON) 25 MG tablet   cholecalciferol (VITAMIN D3) 25 MCG (1000 UNIT) tablet   folic acid (FOLVITE) 1 MG tablet   HYDROcodone-acetaminophen (NORCO) 10-325 MG tablet   leflunomide (ARAVA) 20 MG tablet   levothyroxine (SYNTHROID) 112 MCG tablet   lisinopril (ZESTRIL) 40 MG tablet   methocarbamol (ROBAXIN) 750 MG tablet   omeprazole (PRILOSEC) 20 MG capsule   predniSONE (DELTASONE) 5 MG tablet   promethazine (PHENERGAN) 25 MG tablet   rosuvastatin (CRESTOR) 20 MG tablet   sertraline (ZOLOFT) 100 MG tablet   tamsulosin (FLOMAX) 0.4 MG CAPS capsule   traZODone (DESYREL) 100 MG tablet   No current facility-administered medications for this encounter.    Myra Gianotti, PA-C Surgical Short Stay/Anesthesiology [facility] Phone [phone] [facility] Phone [phone] 07/13/2022 3:47 PM

## 2022-07-20 ENCOUNTER — Inpatient Hospital Stay (HOSPITAL_COMMUNITY): Payer: Medicare Other

## 2022-07-20 ENCOUNTER — Inpatient Hospital Stay (HOSPITAL_COMMUNITY): Payer: Medicare Other | Admitting: Anesthesiology

## 2022-07-20 ENCOUNTER — Other Ambulatory Visit: Payer: Self-pay

## 2022-07-20 ENCOUNTER — Inpatient Hospital Stay (HOSPITAL_COMMUNITY)
Admission: RE | Admit: 2022-07-20 | Discharge: 2022-07-22 | DRG: 455 | Disposition: A | Discharge status: Home / Self Care | Payer: Medicare Other | Attending: Neurosurgery | Admitting: Neurosurgery

## 2022-07-20 ENCOUNTER — Inpatient Hospital Stay (HOSPITAL_COMMUNITY): Payer: Medicare Other | Admitting: Vascular Surgery

## 2022-07-20 ENCOUNTER — Encounter (HOSPITAL_COMMUNITY)
Admission: RE | Disposition: A | Discharge status: Home / Self Care | Payer: Self-pay | Source: Home / Self Care | Attending: Neurosurgery

## 2022-07-20 ENCOUNTER — Encounter (HOSPITAL_COMMUNITY): Payer: Self-pay | Admitting: Neurosurgery

## 2022-07-20 DIAGNOSIS — M4726 Other spondylosis with radiculopathy, lumbar region: Secondary | ICD-10-CM | POA: Diagnosis present

## 2022-07-20 DIAGNOSIS — Z96653 Presence of artificial knee joint, bilateral: Secondary | ICD-10-CM | POA: Diagnosis present

## 2022-07-20 DIAGNOSIS — M4316 Spondylolisthesis, lumbar region: Secondary | ICD-10-CM | POA: Diagnosis present

## 2022-07-20 DIAGNOSIS — Z881 Allergy status to other antibiotic agents status: Secondary | ICD-10-CM

## 2022-07-20 DIAGNOSIS — Z79899 Other long term (current) drug therapy: Secondary | ICD-10-CM | POA: Diagnosis not present

## 2022-07-20 DIAGNOSIS — R3915 Urgency of urination: Secondary | ICD-10-CM | POA: Diagnosis present

## 2022-07-20 DIAGNOSIS — T884XXA Failed or difficult intubation, initial encounter: Secondary | ICD-10-CM

## 2022-07-20 DIAGNOSIS — E039 Hypothyroidism, unspecified: Secondary | ICD-10-CM | POA: Diagnosis present

## 2022-07-20 DIAGNOSIS — K219 Gastro-esophageal reflux disease without esophagitis: Secondary | ICD-10-CM | POA: Diagnosis present

## 2022-07-20 DIAGNOSIS — Z79890 Hormone replacement therapy (postmenopausal): Secondary | ICD-10-CM | POA: Diagnosis not present

## 2022-07-20 DIAGNOSIS — M48062 Spinal stenosis, lumbar region with neurogenic claudication: Secondary | ICD-10-CM | POA: Diagnosis present

## 2022-07-20 DIAGNOSIS — Z981 Arthrodesis status: Secondary | ICD-10-CM

## 2022-07-20 DIAGNOSIS — M069 Rheumatoid arthritis, unspecified: Secondary | ICD-10-CM | POA: Diagnosis present

## 2022-07-20 DIAGNOSIS — M5116 Intervertebral disc disorders with radiculopathy, lumbar region: Secondary | ICD-10-CM | POA: Diagnosis present

## 2022-07-20 DIAGNOSIS — Z7952 Long term (current) use of systemic steroids: Secondary | ICD-10-CM

## 2022-07-20 DIAGNOSIS — I10 Essential (primary) hypertension: Secondary | ICD-10-CM | POA: Diagnosis present

## 2022-07-20 DIAGNOSIS — Z79891 Long term (current) use of opiate analgesic: Secondary | ICD-10-CM

## 2022-07-20 DIAGNOSIS — Z87442 Personal history of urinary calculi: Secondary | ICD-10-CM | POA: Diagnosis not present

## 2022-07-20 DIAGNOSIS — M545 Low back pain, unspecified: Secondary | ICD-10-CM | POA: Diagnosis present

## 2022-07-20 DIAGNOSIS — Z888 Allergy status to other drugs, medicaments and biological substances status: Secondary | ICD-10-CM | POA: Diagnosis not present

## 2022-07-20 DIAGNOSIS — M1990 Unspecified osteoarthritis, unspecified site: Secondary | ICD-10-CM | POA: Diagnosis present

## 2022-07-20 DIAGNOSIS — Z01818 Encounter for other preprocedural examination: Principal | ICD-10-CM

## 2022-07-20 HISTORY — DX: Failed or difficult intubation, initial encounter: T88.4XXA

## 2022-07-20 SURGERY — POSTERIOR LUMBAR FUSION 1 LEVEL
Anesthesia: General

## 2022-07-20 MED ORDER — ACETAMINOPHEN 650 MG RE SUPP: 650.0000 mg | RECTAL | Status: DC | PRN

## 2022-07-20 MED ORDER — BISACODYL 10 MG RE SUPP: 10.0000 mg | Freq: Every day | RECTAL | Status: DC | PRN

## 2022-07-20 MED ORDER — CHLORTHALIDONE 25 MG PO TABS
25.0000 mg | ORAL_TABLET | Freq: Every day | ORAL | Status: DC
  Administered 2022-07-20 – 2022-07-22 (×3): 25 mg via ORAL
  Filled 2022-07-20 (×3): qty 1

## 2022-07-20 MED ORDER — TRAZODONE HCL 100 MG PO TABS
100.0000 mg | ORAL_TABLET | Freq: Every day | ORAL | Status: DC
Start: 2022-07-20 — End: 2022-07-22
  Administered 2022-07-20 – 2022-07-21 (×2): 100 mg via ORAL
  Filled 2022-07-20 (×2): qty 1

## 2022-07-20 MED ORDER — CHLORHEXIDINE GLUCONATE 0.12 % MT SOLN: 15.0000 mL | Freq: Once | OROMUCOSAL | Status: AC

## 2022-07-20 MED ORDER — LIDOCAINE 2% (20 MG/ML) 5 ML SYRINGE
INTRAMUSCULAR | Status: DC | PRN
  Administered 2022-07-20: 80 mg via INTRAVENOUS

## 2022-07-20 MED ORDER — FOLIC ACID 1 MG PO TABS
1.0000 mg | ORAL_TABLET | Freq: Every day | ORAL | Status: DC
  Administered 2022-07-20 – 2022-07-22 (×3): 1 mg via ORAL
  Filled 2022-07-20 (×3): qty 1

## 2022-07-20 MED ORDER — ONDANSETRON HCL 4 MG PO TABS: 4.0000 mg | ORAL_TABLET | Freq: Four times a day (QID) | ORAL | Status: DC | PRN

## 2022-07-20 MED ORDER — FENTANYL CITRATE (PF) 250 MCG/5ML IJ SOLN
INTRAMUSCULAR | Status: AC
  Filled 2022-07-20: qty 5

## 2022-07-20 MED ORDER — BACITRACIN ZINC 500 UNIT/GM EX OINT
TOPICAL_OINTMENT | CUTANEOUS | Status: DC | PRN
  Administered 2022-07-20: 1 via TOPICAL

## 2022-07-20 MED ORDER — PROPOFOL 10 MG/ML IV BOLUS
INTRAVENOUS | Status: AC
  Filled 2022-07-20: qty 20

## 2022-07-20 MED ORDER — SCOPOLAMINE 1 MG/3DAYS TD PT72: 1.0000 | MEDICATED_PATCH | Freq: Once | TRANSDERMAL | Status: DC

## 2022-07-20 MED ORDER — CHLORHEXIDINE GLUCONATE CLOTH 2 % EX PADS: 6.0000 | MEDICATED_PAD | Freq: Once | CUTANEOUS | Status: DC

## 2022-07-20 MED ORDER — BACITRACIN ZINC 500 UNIT/GM EX OINT
TOPICAL_OINTMENT | CUTANEOUS | Status: AC
  Filled 2022-07-20: qty 28.35

## 2022-07-20 MED ORDER — HYDROMORPHONE HCL 1 MG/ML IJ SOLN
INTRAMUSCULAR | Status: AC
  Administered 2022-07-20: 0.5 mg via INTRAVENOUS
  Filled 2022-07-20: qty 1

## 2022-07-20 MED ORDER — CEFAZOLIN SODIUM-DEXTROSE 2-4 GM/100ML-% IV SOLN
2.0000 g | Freq: Three times a day (TID) | INTRAVENOUS | Status: AC
  Administered 2022-07-20 – 2022-07-21 (×2): 2 g via INTRAVENOUS
  Filled 2022-07-20 (×2): qty 100

## 2022-07-20 MED ORDER — ACETAMINOPHEN 500 MG PO TABS
1000.0000 mg | ORAL_TABLET | Freq: Four times a day (QID) | ORAL | Status: AC
  Administered 2022-07-20 – 2022-07-21 (×3): 1000 mg via ORAL
  Filled 2022-07-20 (×3): qty 2

## 2022-07-20 MED ORDER — VITAMIN D 25 MCG (1000 UNIT) PO TABS
1000.0000 [IU] | ORAL_TABLET | Freq: Every day | ORAL | Status: DC
  Administered 2022-07-20 – 2022-07-22 (×3): 1000 [IU] via ORAL
  Filled 2022-07-20 (×3): qty 1

## 2022-07-20 MED ORDER — ONDANSETRON HCL 4 MG/2ML IJ SOLN: 4.0000 mg | Freq: Four times a day (QID) | INTRAMUSCULAR | Status: DC | PRN

## 2022-07-20 MED ORDER — ACETAMINOPHEN 325 MG PO TABS: 650.0000 mg | ORAL_TABLET | ORAL | Status: DC | PRN

## 2022-07-20 MED ORDER — POLYVINYL ALCOHOL 1.4 % OP SOLN: 1.0000 [drp] | Freq: Three times a day (TID) | OPHTHALMIC | Status: DC | PRN

## 2022-07-20 MED ORDER — MIDAZOLAM HCL 5 MG/5ML IJ SOLN
INTRAMUSCULAR | Status: DC | PRN
  Administered 2022-07-20 (×2): 1 mg via INTRAVENOUS

## 2022-07-20 MED ORDER — HYDROMORPHONE HCL 1 MG/ML IJ SOLN: 0.5000 mg | INTRAMUSCULAR | Status: AC

## 2022-07-20 MED ORDER — TAMSULOSIN HCL 0.4 MG PO CAPS
0.4000 mg | ORAL_CAPSULE | Freq: Every day | ORAL | Status: DC
  Administered 2022-07-20 – 2022-07-22 (×3): 0.4 mg via ORAL
  Filled 2022-07-20 (×3): qty 1

## 2022-07-20 MED ORDER — LACTATED RINGERS IV SOLN
INTRAVENOUS | Status: DC
Start: 2022-07-20 — End: 2022-07-20

## 2022-07-20 MED ORDER — CHLORHEXIDINE GLUCONATE 0.12 % MT SOLN
OROMUCOSAL | Status: AC
  Administered 2022-07-20: 15 mL via OROMUCOSAL
  Filled 2022-07-20: qty 15

## 2022-07-20 MED ORDER — LISINOPRIL 20 MG PO TABS
40.0000 mg | ORAL_TABLET | Freq: Every day | ORAL | Status: DC
  Administered 2022-07-20 – 2022-07-22 (×3): 40 mg via ORAL
  Filled 2022-07-20 (×3): qty 2

## 2022-07-20 MED ORDER — FENTANYL CITRATE (PF) 100 MCG/2ML IJ SOLN
INTRAMUSCULAR | Status: DC | PRN
  Administered 2022-07-20: 25 ug via INTRAVENOUS
  Administered 2022-07-20 (×2): 50 ug via INTRAVENOUS
  Administered 2022-07-20: 100 ug via INTRAVENOUS
  Administered 2022-07-20 (×2): 50 ug via INTRAVENOUS

## 2022-07-20 MED ORDER — ROCURONIUM BROMIDE 10 MG/ML (PF) SYRINGE
PREFILLED_SYRINGE | INTRAVENOUS | Status: DC | PRN
  Administered 2022-07-20: 20 mg via INTRAVENOUS
  Administered 2022-07-20: 70 mg via INTRAVENOUS
  Administered 2022-07-20: 10 mg via INTRAVENOUS

## 2022-07-20 MED ORDER — ROSUVASTATIN CALCIUM 20 MG PO TABS
20.0000 mg | ORAL_TABLET | Freq: Every day | ORAL | Status: DC
  Administered 2022-07-21 – 2022-07-22 (×2): 20 mg via ORAL
  Filled 2022-07-20 (×2): qty 1

## 2022-07-20 MED ORDER — PHENOL 1.4 % MT LIQD
1.0000 | OROMUCOSAL | Status: DC | PRN
Start: 2022-07-20 — End: 2022-07-22

## 2022-07-20 MED ORDER — CEFAZOLIN SODIUM-DEXTROSE 2-4 GM/100ML-% IV SOLN
INTRAVENOUS | Status: AC
  Filled 2022-07-20: qty 100

## 2022-07-20 MED ORDER — SCOPOLAMINE 1 MG/3DAYS TD PT72
MEDICATED_PATCH | TRANSDERMAL | Status: AC
  Administered 2022-07-20: 1.5 mg via TRANSDERMAL
  Filled 2022-07-20: qty 1

## 2022-07-20 MED ORDER — OXYCODONE HCL 5 MG PO TABS
10.0000 mg | ORAL_TABLET | ORAL | Status: DC | PRN
  Filled 2022-07-20: qty 2

## 2022-07-20 MED ORDER — ONDANSETRON HCL 4 MG/2ML IJ SOLN
INTRAMUSCULAR | Status: AC
  Filled 2022-07-20: qty 2

## 2022-07-20 MED ORDER — MIDAZOLAM HCL 2 MG/2ML IJ SOLN
INTRAMUSCULAR | Status: AC
  Filled 2022-07-20: qty 2

## 2022-07-20 MED ORDER — SODIUM CHLORIDE 0.9 % IV SOLN
250.0000 mL | INTRAVENOUS | Status: DC
  Administered 2022-07-20: 250 mL via INTRAVENOUS

## 2022-07-20 MED ORDER — LEVOTHYROXINE SODIUM 112 MCG PO TABS
112.0000 ug | ORAL_TABLET | Freq: Every day | ORAL | Status: DC
  Administered 2022-07-21 – 2022-07-22 (×2): 112 ug via ORAL
  Filled 2022-07-20 (×2): qty 1

## 2022-07-20 MED ORDER — ORAL CARE MOUTH RINSE: 15.0000 mL | Freq: Once | OROMUCOSAL | Status: AC

## 2022-07-20 MED ORDER — DOCUSATE SODIUM 100 MG PO CAPS
100.0000 mg | ORAL_CAPSULE | Freq: Two times a day (BID) | ORAL | Status: DC
  Administered 2022-07-20 – 2022-07-22 (×4): 100 mg via ORAL
  Filled 2022-07-20 (×4): qty 1

## 2022-07-20 MED ORDER — HYDROMORPHONE HCL 1 MG/ML IJ SOLN
0.2500 mg | INTRAMUSCULAR | Status: DC | PRN
  Administered 2022-07-20 (×2): 0.5 mg via INTRAVENOUS

## 2022-07-20 MED ORDER — METHOCARBAMOL 750 MG PO TABS: 750.0000 mg | ORAL_TABLET | Freq: Four times a day (QID) | ORAL | Status: DC | PRN

## 2022-07-20 MED ORDER — SODIUM CHLORIDE 0.9% FLUSH: 3.0000 mL | INTRAVENOUS | Status: DC | PRN

## 2022-07-20 MED ORDER — SUFENTANIL CITRATE 50 MCG/ML IV SOLN
INTRAVENOUS | Status: AC
  Filled 2022-07-20: qty 1

## 2022-07-20 MED ORDER — PANTOPRAZOLE SODIUM 40 MG PO TBEC
40.0000 mg | DELAYED_RELEASE_TABLET | Freq: Every day | ORAL | Status: DC
  Administered 2022-07-20 – 2022-07-22 (×3): 40 mg via ORAL
  Filled 2022-07-20 (×3): qty 1

## 2022-07-20 MED ORDER — OXYCODONE HCL 5 MG PO TABS: 5.0000 mg | ORAL_TABLET | ORAL | Status: DC | PRN

## 2022-07-20 MED ORDER — PREDNISONE 5 MG PO TABS
5.0000 mg | ORAL_TABLET | Freq: Every day | ORAL | Status: DC
  Administered 2022-07-22: 5 mg via ORAL
  Filled 2022-07-20: qty 1

## 2022-07-20 MED ORDER — MORPHINE SULFATE (PF) 2 MG/ML IV SOLN
4.0000 mg | INTRAVENOUS | Status: DC | PRN
  Administered 2022-07-20 – 2022-07-21 (×3): 4 mg via INTRAVENOUS
  Filled 2022-07-20 (×3): qty 2

## 2022-07-20 MED ORDER — ONDANSETRON HCL 4 MG/2ML IJ SOLN: 4.0000 mg | Freq: Once | INTRAMUSCULAR | Status: DC | PRN

## 2022-07-20 MED ORDER — ONDANSETRON HCL 4 MG/2ML IJ SOLN
INTRAMUSCULAR | Status: DC | PRN
  Administered 2022-07-20: 4 mg via INTRAVENOUS

## 2022-07-20 MED ORDER — MENTHOL 3 MG MT LOZG
1.0000 | LOZENGE | OROMUCOSAL | Status: DC | PRN
Start: 2022-07-20 — End: 2022-07-22

## 2022-07-20 MED ORDER — THROMBIN 5000 UNITS EX SOLR
CUTANEOUS | Status: AC
  Filled 2022-07-20: qty 5000

## 2022-07-20 MED ORDER — AMISULPRIDE (ANTIEMETIC) 5 MG/2ML IV SOLN: 10.0000 mg | Freq: Once | INTRAVENOUS | Status: DC | PRN

## 2022-07-20 MED ORDER — ROCURONIUM BROMIDE 10 MG/ML (PF) SYRINGE
PREFILLED_SYRINGE | INTRAVENOUS | Status: AC
  Filled 2022-07-20: qty 10

## 2022-07-20 MED ORDER — SODIUM CHLORIDE 0.9% FLUSH
3.0000 mL | Freq: Two times a day (BID) | INTRAVENOUS | Status: DC
  Administered 2022-07-20 – 2022-07-22 (×4): 3 mL via INTRAVENOUS

## 2022-07-20 MED ORDER — CEFAZOLIN SODIUM-DEXTROSE 2-4 GM/100ML-% IV SOLN
2.0000 g | INTRAVENOUS | Status: AC
  Administered 2022-07-20: 2 g via INTRAVENOUS

## 2022-07-20 MED ORDER — METHYLPREDNISOLONE SODIUM SUCC 125 MG IJ SOLR
125.0000 mg | Freq: Two times a day (BID) | INTRAMUSCULAR | Status: AC
  Administered 2022-07-20 – 2022-07-21 (×3): 125 mg via INTRAVENOUS
  Filled 2022-07-20 (×3): qty 2

## 2022-07-20 MED ORDER — LEFLUNOMIDE 20 MG PO TABS
20.0000 mg | ORAL_TABLET | Freq: Every day | ORAL | Status: DC
  Administered 2022-07-20 – 2022-07-22 (×3): 20 mg via ORAL
  Filled 2022-07-20 (×3): qty 1

## 2022-07-20 MED ORDER — LIDOCAINE 2% (20 MG/ML) 5 ML SYRINGE
INTRAMUSCULAR | Status: AC
  Filled 2022-07-20: qty 5

## 2022-07-20 MED ORDER — DEXAMETHASONE SODIUM PHOSPHATE 10 MG/ML IJ SOLN
INTRAMUSCULAR | Status: AC
  Filled 2022-07-20: qty 1

## 2022-07-20 MED ORDER — PROPOFOL 10 MG/ML IV BOLUS
INTRAVENOUS | Status: DC | PRN
  Administered 2022-07-20: 100 mg via INTRAVENOUS

## 2022-07-20 MED ORDER — PROMETHAZINE HCL 25 MG PO TABS
25.0000 mg | ORAL_TABLET | Freq: Four times a day (QID) | ORAL | Status: DC | PRN
Start: 2022-07-20 — End: 2022-07-22

## 2022-07-20 MED ORDER — SCOPOLAMINE 1 MG/3DAYS TD PT72: 1.0000 | MEDICATED_PATCH | TRANSDERMAL | Status: DC

## 2022-07-20 MED ORDER — GLYCOPYRROLATE 0.2 MG/ML IJ SOLN
INTRAMUSCULAR | Status: DC | PRN
  Administered 2022-07-20: .2 mg via INTRAVENOUS

## 2022-07-20 MED ORDER — BUPIVACAINE LIPOSOME 1.3 % IJ SUSP
INTRAMUSCULAR | Status: AC
  Filled 2022-07-20: qty 20

## 2022-07-20 MED ORDER — SUGAMMADEX SODIUM 200 MG/2ML IV SOLN
INTRAVENOUS | Status: DC | PRN
  Administered 2022-07-20: 200 mg via INTRAVENOUS

## 2022-07-20 MED ORDER — THROMBIN 5000 UNITS EX SOLR
OROMUCOSAL | Status: DC | PRN
  Administered 2022-07-20: 5 mL via TOPICAL

## 2022-07-20 MED ORDER — SERTRALINE HCL 100 MG PO TABS
100.0000 mg | ORAL_TABLET | Freq: Every day | ORAL | Status: DC
  Administered 2022-07-21 – 2022-07-22 (×2): 100 mg via ORAL
  Filled 2022-07-20 (×2): qty 1

## 2022-07-20 MED ORDER — 0.9 % SODIUM CHLORIDE (POUR BTL) OPTIME
TOPICAL | Status: DC | PRN
  Administered 2022-07-20: 1000 mL

## 2022-07-20 MED ORDER — DEXAMETHASONE SODIUM PHOSPHATE 4 MG/ML IJ SOLN
INTRAMUSCULAR | Status: DC | PRN
  Administered 2022-07-20: 6 mg via INTRAVENOUS

## 2022-07-20 MED ORDER — HYDROCODONE-ACETAMINOPHEN 10-325 MG PO TABS
1.0000 | ORAL_TABLET | ORAL | Status: DC | PRN
  Administered 2022-07-20 – 2022-07-22 (×6): 1 via ORAL
  Filled 2022-07-20 (×6): qty 1

## 2022-07-20 MED ORDER — BUPIVACAINE-EPINEPHRINE 0.5% -1:200000 IJ SOLN
INTRAMUSCULAR | Status: AC
  Filled 2022-07-20: qty 1

## 2022-07-20 MED ORDER — GLYCOPYRROLATE PF 0.2 MG/ML IJ SOSY
PREFILLED_SYRINGE | INTRAMUSCULAR | Status: AC
Start: 2022-07-20 — End: ?
  Filled 2022-07-20: qty 1

## 2022-07-20 MED ORDER — BUPIVACAINE-EPINEPHRINE (PF) 0.5% -1:200000 IJ SOLN
INTRAMUSCULAR | Status: DC | PRN
  Administered 2022-07-20: 10 mL via PERINEURAL

## 2022-07-20 MED ORDER — MIDAZOLAM HCL 2 MG/2ML IJ SOLN
INTRAMUSCULAR | Status: AC
Start: 2022-07-20 — End: ?
  Filled 2022-07-20: qty 2

## 2022-07-20 SURGICAL SUPPLY — 71 items
BAG COUNTER SPONGE SURGICOUNT (BAG) ×2 IMPLANT
BASKET BONE COLLECTION (BASKET) ×2 IMPLANT
BENZOIN TINCTURE PRP APPL 2/3 (GAUZE/BANDAGES/DRESSINGS) ×2 IMPLANT
BLADE CLIPPER SURG (BLADE) ×1 IMPLANT
BUR MATCHSTICK NEURO 3.0 LAGG (BURR) ×2 IMPLANT
BUR PRECISION FLUTE 6.0 (BURR) ×2 IMPLANT
CAGE SABLE 10X22 6-12 8D (Cage) ×1 IMPLANT
CANISTER SUCT 3000ML PPV (MISCELLANEOUS) ×2 IMPLANT
CAP REVERE LOCKING (Cap) ×10 IMPLANT
CARTRIDGE OIL MAESTRO DRILL (MISCELLANEOUS) ×1 IMPLANT
CLOSURE STERI STRIP 1/2 X4 (GAUZE/BANDAGES/DRESSINGS) ×1 IMPLANT
CNTNR URN SCR LID CUP LEK RST (MISCELLANEOUS) ×1 IMPLANT
CONN CROSSLINK REV 6.35 48-60 (Connector) ×2 IMPLANT
CONNECTOR CRSLNK REV6.35 48-60 (Connector) IMPLANT
CONT SPEC 4OZ STRL OR WHT (MISCELLANEOUS) ×2
COVER BACK TABLE 60X90IN (DRAPES) ×2 IMPLANT
DIFFUSER DRILL AIR PNEUMATIC (MISCELLANEOUS) ×2 IMPLANT
DRAPE C-ARM 42X72 X-RAY (DRAPES) ×5 IMPLANT
DRAPE HALF SHEET 40X57 (DRAPES) ×2 IMPLANT
DRAPE LAPAROTOMY 100X72X124 (DRAPES) ×2 IMPLANT
DRAPE SURG 17X23 STRL (DRAPES) ×8 IMPLANT
DRSG OPSITE POSTOP 4X10 (GAUZE/BANDAGES/DRESSINGS) ×1 IMPLANT
DRSG OPSITE POSTOP 4X6 (GAUZE/BANDAGES/DRESSINGS) ×1 IMPLANT
ELECT BLADE 4.0 EZ CLEAN MEGAD (MISCELLANEOUS) ×2
ELECT REM PT RETURN 9FT ADLT (ELECTROSURGICAL) ×2
ELECTRODE BLDE 4.0 EZ CLN MEGD (MISCELLANEOUS) ×1 IMPLANT
ELECTRODE REM PT RTRN 9FT ADLT (ELECTROSURGICAL) ×1 IMPLANT
EVACUATOR 1/8 PVC DRAIN (DRAIN) IMPLANT
GAUZE 4X4 16PLY ~~LOC~~+RFID DBL (SPONGE) ×1 IMPLANT
GLOVE BIO SURGEON STRL SZ 6 (GLOVE) ×2 IMPLANT
GLOVE BIO SURGEON STRL SZ8 (GLOVE) ×4 IMPLANT
GLOVE BIO SURGEON STRL SZ8.5 (GLOVE) ×4 IMPLANT
GLOVE BIOGEL PI IND STRL 6.5 (GLOVE) ×1 IMPLANT
GLOVE BIOGEL PI IND STRL 7.5 (GLOVE) IMPLANT
GLOVE BIOGEL PI INDICATOR 6.5 (GLOVE) ×1
GLOVE BIOGEL PI INDICATOR 7.5 (GLOVE) ×3
GLOVE ECLIPSE 7.0 STRL STRAW (GLOVE) ×1 IMPLANT
GLOVE EXAM NITRILE XL STR (GLOVE) IMPLANT
GOWN STRL REUS W/ TWL LRG LVL3 (GOWN DISPOSABLE) ×1 IMPLANT
GOWN STRL REUS W/ TWL XL LVL3 (GOWN DISPOSABLE) ×2 IMPLANT
GOWN STRL REUS W/TWL 2XL LVL3 (GOWN DISPOSABLE) IMPLANT
GOWN STRL REUS W/TWL LRG LVL3 (GOWN DISPOSABLE) ×2
GOWN STRL REUS W/TWL XL LVL3 (GOWN DISPOSABLE) ×8
HEMOSTAT POWDER KIT SURGIFOAM (HEMOSTASIS) ×2 IMPLANT
KIT BASIN OR (CUSTOM PROCEDURE TRAY) ×2 IMPLANT
KIT GRAFTMAG DEL NEURO DISP (NEUROSURGERY SUPPLIES) ×1 IMPLANT
KIT TURNOVER KIT B (KITS) ×2 IMPLANT
NDL HYPO 21X1.5 SAFETY (NEEDLE) IMPLANT
NEEDLE HYPO 21X1.5 SAFETY (NEEDLE) ×2 IMPLANT
NEEDLE HYPO 22GX1.5 SAFETY (NEEDLE) ×2 IMPLANT
NS IRRIG 1000ML POUR BTL (IV SOLUTION) ×2 IMPLANT
OIL CARTRIDGE MAESTRO DRILL (MISCELLANEOUS) ×2
PACK LAMINECTOMY NEURO (CUSTOM PROCEDURE TRAY) ×2 IMPLANT
PAD ARMBOARD 7.5X6 YLW CONV (MISCELLANEOUS) ×6 IMPLANT
PATTIES SURGICAL .5 X1 (DISPOSABLE) IMPLANT
PUTTY DBM 5CC CALC GRAN (Putty) ×2 IMPLANT
ROD STRT TI 6.35X150 (Rod) ×2 IMPLANT
SCREW REVERE 6.35 75X55MM (Screw) ×2 IMPLANT
SPIKE FLUID TRANSFER (MISCELLANEOUS) ×1 IMPLANT
SPONGE NEURO XRAY DETECT 1X3 (DISPOSABLE) IMPLANT
SPONGE SURGIFOAM ABS GEL 100 (HEMOSTASIS) IMPLANT
SPONGE T-LAP 4X18 ~~LOC~~+RFID (SPONGE) IMPLANT
STRIP CLOSURE SKIN 1/2X4 (GAUZE/BANDAGES/DRESSINGS) ×2 IMPLANT
SUT VIC AB 1 CT1 18XBRD ANBCTR (SUTURE) ×2 IMPLANT
SUT VIC AB 1 CT1 8-18 (SUTURE) ×3
SUT VIC AB 2-0 CP2 18 (SUTURE) ×5 IMPLANT
SYR 20ML LL LF (SYRINGE) ×1 IMPLANT
TOWEL GREEN STERILE (TOWEL DISPOSABLE) ×2 IMPLANT
TOWEL GREEN STERILE FF (TOWEL DISPOSABLE) ×2 IMPLANT
TRAY FOLEY MTR SLVR 16FR STAT (SET/KITS/TRAYS/PACK) ×2 IMPLANT
WATER STERILE IRR 1000ML POUR (IV SOLUTION) ×2 IMPLANT

## 2022-07-20 NOTE — Progress Notes (Signed)
Orthopedic Tech Progress Note Patient Details:  George Johnston 12/20/1940 [id number]  Patient ID: [name], male   DOB: Oct 20, 1941, 81 y.o.   MRN: [id number] Patient already has an LSO.  Darleen Crocker 07/20/2022, 9:03 PM

## 2022-07-20 NOTE — H&P (Signed)
Subjective: The patient is a 81 year old white male whose had multiple previous back surgeries/fusions.  He is developed recurrent back pain consistent with neurogenic claudication.  He has failed medical management and was worked up with a lumbar MRI which demonstrated adjacent segment disease and stenosis at L1-2.  I discussed the various treatment options.  He has decided proceed with surgery.  Past Medical History:  Diagnosis Date   Arthritis    GERD (gastroesophageal reflux disease)    History of kidney stones    Hypertension    Hypothyroidism    PONV (postoperative nausea and vomiting)    Does not have it, if given Zofran and a patch   RA (rheumatoid arthritis) ([facility])     Past Surgical History:  Procedure Laterality Date   JOINT REPLACEMENT  2002 & 2003   both knees   TONSILLECTOMY      Allergies  Allergen Reactions   Erythromycin Nausea Only   Erythromycin Base Nausea Only   Gabapentin Nausea Only    Social History   Tobacco Use   Smoking status: Never   Smokeless tobacco: Never  Substance Use Topics   Alcohol use: Yes    Alcohol/week: 7.0 standard drinks of alcohol    Types: 7 Glasses of wine per week    History reviewed. No pertinent family history. Prior to Admission medications   Medication Sig Start Date End Date Taking? Authorizing Provider  carboxymethylcellulose (REFRESH PLUS) 0.5 % SOLN Place 1 drop into both eyes 3 (three) times daily as needed (dry eyes).   Yes [provider]  chlorthalidone (HYGROTON) 25 MG tablet Take 25 mg by mouth daily.   Yes [provider]  cholecalciferol (VITAMIN D3) 25 MCG (1000 UNIT) tablet Take 1,000 Units by mouth daily.   Yes [provider]  folic acid (FOLVITE) 1 MG tablet Take 1 mg by mouth in the morning and at bedtime.   Yes [provider]  HYDROcodone-acetaminophen (NORCO) 10-325 MG tablet Take 1 tablet by mouth in the morning, at noon, and at bedtime.   Yes [provider]   leflunomide (ARAVA) 20 MG tablet Take 20 mg by mouth daily.   Yes [provider]  levothyroxine (SYNTHROID) 112 MCG tablet Take 112 mcg by mouth daily before breakfast.   Yes [provider]  lisinopril (ZESTRIL) 40 MG tablet Take 40 mg by mouth daily.   Yes [provider]  methocarbamol (ROBAXIN) 750 MG tablet Take 750 mg by mouth in the morning and at bedtime.   Yes [provider]  omeprazole (PRILOSEC) 20 MG capsule Take 20 mg by mouth daily as needed (acid reflux).   Yes [provider]  predniSONE (DELTASONE) 5 MG tablet Take 5 mg by mouth daily with breakfast.   Yes [provider]  promethazine (PHENERGAN) 25 MG tablet Take 25 mg by mouth in the morning, at noon, in the evening, and at bedtime.   Yes [provider]  rosuvastatin (CRESTOR) 20 MG tablet Take 20 mg by mouth daily.   Yes [provider]  sertraline (ZOLOFT) 100 MG tablet Take 100 mg by mouth daily.   Yes [provider]  tamsulosin (FLOMAX) 0.4 MG CAPS capsule Take 0.4 mg by mouth daily.   Yes [provider]  traZODone (DESYREL) 100 MG tablet Take 100 mg by mouth at bedtime.   Yes [provider]     Review of Systems  Positive ROS: As above  All other systems have been  reviewed and were otherwise negative with the exception of those mentioned in the HPI and as above.  Objective: Vital signs in last 24 hours: Temp:  [97.9 F (36.6 C)] 97.9 F (36.6 C) (08/09 1043) Pulse Rate:  [94] 94 (08/09 1043) Resp:  [18] 18 (08/09 1043) BP: (118)/(73) 118/73 (08/09 1043) SpO2:  [100 %] 100 % (08/09 1043) Weight:  [63.5 kg] 63.5 kg (08/09 1043) Estimated body mass index is 20.67 kg/m as calculated from the following:   Height as of this encounter: 5\' 9"  (1.753 m).   Weight as of this encounter: 63.5 kg.   General Appearance: Alert Head: Normocephalic, without obvious abnormality, atraumatic Eyes: PERRL,  conjunctiva/corneas clear, EOM's intact,    Ears: Normal  Throat: Normal  Neck: Supple, Back: Antalgic gait, his lumbar incision is well-healed. Lungs: Clear to auscultation bilaterally, respirations unlabored Heart: Regular rate and rhythm, no murmur, rub or gallop Abdomen: Soft, non-tender Extremities: Extremities normal, atraumatic, no cyanosis or edema Skin: unremarkable  NEUROLOGIC:   Mental status: alert and oriented,Motor Exam - grossly normal Sensory Exam - grossly normal Reflexes:  Coordination - grossly normal Gait - grossly normal Balance - grossly normal Cranial Nerves: I: smell Not tested  II: visual acuity  OS: Normal  OD: Normal   II: visual fields Full to confrontation  II: pupils Equal, round, reactive to light  III,VII: ptosis None  III,IV,VI: extraocular muscles  Full ROM  V: mastication Normal  V: facial light touch sensation  Normal  V,VII: corneal reflex  Present  VII: facial muscle function - upper  Normal  VII: facial muscle function - lower Normal  VIII: hearing Not tested  IX: soft palate elevation  Normal  IX,X: gag reflex Present  XI: trapezius strength  5/5  XI: sternocleidomastoid strength 5/5  XI: neck flexion strength  5/5  XII: tongue strength  Normal    Data Review Lab Results  Component Value Date   WBC 5.6 07/12/2022   HGB 9.5 (L) 07/12/2022   HCT 30.7 (L) 07/12/2022   MCV 104.4 (H) 07/12/2022   PLT 432 (H) 07/12/2022   Lab Results  Component Value Date   NA 140 07/12/2022   K 3.8 07/12/2022   CL 105 07/12/2022   CO2 30 07/12/2022   BUN 17 07/12/2022   CREATININE 1.23 07/12/2022   GLUCOSE 103 (H) 07/12/2022   No results found for: "INR", "PROTIME"  Assessment/Plan: 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  I reviewed his imaging studies with him and pointed out the abnormalities.  We have discussed the various treatment options including  surgery.  I described the surgical treatment option of an L1-2 decompression, instrumentation and fusion.  I have shown him surgical models.  I have given him a surgical pamphlet.  We have discussed the risk, benefits, alternatives, expected postoperative course, and likelihood of achieving our goals with surgery.  I have answered all his questions.  He has decided to proceed with surgery.   09/11/2022 07/20/2022 11:38 AM

## 2022-07-20 NOTE — Anesthesia Postprocedure Evaluation (Signed)
Anesthesia Post Note  Patient: George Johnston  Procedure(s) Performed: Posterior Lumbar Interbody Fusion ,Interbody Prothesis ,POSTERIOR INSTRUMENTATION Lumbar one-two;EXPLORE FUSION     Patient location during evaluation: PACU Anesthesia Type: General Level of consciousness: awake and alert and oriented Pain management: pain level controlled Vital Signs Assessment: post-procedure vital signs reviewed and stable Respiratory status: spontaneous breathing, nonlabored ventilation and respiratory function stable Cardiovascular status: blood pressure returned to baseline and stable Anesthetic complications: yes   Encounter Notable Events  Notable Event Outcome Phase Comment  Difficult to intubate - expected  Intraprocedure Filed from anesthesia note documentation.    Last Vitals:  Vitals:   07/20/22 1645 07/20/22 1700  BP: (!) 173/92   Pulse: 93 88  Resp: 13 (!) 8  Temp:    SpO2: 96% 100%    Last Pain:  Vitals:   07/20/22 1700  TempSrc:   PainSc: Asleep                 [name] A.

## 2022-07-20 NOTE — Anesthesia Procedure Notes (Signed)
Procedure Name: Intubation Date/Time: 07/20/2022 11:58 AM  Performed by: Caren Macadam, CRNAPre-anesthesia Checklist: Patient identified, Emergency Drugs available, Suction available and Patient being monitored Patient Re-evaluated:Patient Re-evaluated prior to induction Oxygen Delivery Method: Circle system utilized Preoxygenation: Pre-oxygenation with 100% oxygen Induction Type: IV induction Ventilation: Mask ventilation without difficulty Laryngoscope Size: Glidescope Grade View: Grade I Tube type: Oral Tube size: 7.0 mm Number of attempts: 1 Airway Equipment and Method: Video-laryngoscopy and Rigid stylet Placement Confirmation: ETT inserted through vocal cords under direct vision, positive ETCO2 and breath sounds checked- equal and bilateral Secured at: 22 cm Tube secured with: Tape Dental Injury: Teeth and Oropharynx as per pre-operative assessment  Difficulty Due To: Difficulty was anticipated, Difficult Airway- due to reduced neck mobility, Difficult Airway- due to anterior larynx, Difficult Airway- due to limited oral opening and Difficult Airway- due to dentition

## 2022-07-20 NOTE — Op Note (Signed)
Brief history: The patient is a 81 year old white male whose had multiple prior back surgeries/fusions.  He had a previous L1-2 discectomy on the left.  He has developed recurrent and worsening back and leg pain consistent with neurogenic claudication.  He has failed medical management and was worked up with a lumbar myelo CT and lumbar MRI.  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  I discussed the various treatment options with him.  He has decided proceed with surgery.  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 Etc. requiring a wide decompression of the nerve roots.);  L1-2 transforaminal lumbar interbody fusion with local morselized autograft bone and Zimmer DBM; insertion of interbody prosthesis at L1-2 (globus peek expandable interbody prosthesis); posterior segmental instrumentation from L1 to L5 with globus titanium pedicle screws and rods; posterior lateral arthrodesis at 1-2 with local morselized autograft bone and Zimmer DBM; exploration of lumbar fusion/removal of lumbar hardware.  Surgeon: Dr. Delma Officer  Asst.: Hildred Priest, NP  Anesthesia: Gen. endotracheal  Estimated blood loss: 250 cc  Drains: None  Complications: None  Description of procedure: The patient was brought to the operating room by the anesthesia team. General endotracheal anesthesia was induced. The patient was turned to the prone position on the Wilson frame. The patient's lumbosacral  region was then prepared with Betadine scrub and Betadine solution. Sterile drapes were applied.  I then injected the area to be incised with Marcaine with epinephrine solution. I then used the scalpel to make a linear midline incision over the L1-2, L2-3 L3-4 and L4-5 interspace. I then used electrocautery to perform a bilateral subperiosteal dissection exposing the spinous process and lamina of L1-2, L2-3, L3-4 and L4-5 and exposing the old hardware from L2-L5 bilaterally.  We then inserted the Verstrac retractor to provide exposure.  We explored the fusion by removing the caps from the old rods and removing the cross connector.  We remove the rods.  We inspected the arthrodesis at L2-3, L3-4 and L4-5 bilaterally.  It appeared solid.  I began the decompression by using the high speed drill to perform laminotomies at L1-2 bilaterally. We then used the Kerrison punches to widen the laminotomy and removed the ligamentum flavum at L1-2 bilaterally. We used the Kerrison punches to remove the facets at L1-2 bilaterally.  As expected there was epidural scar tissue at L1-2 on the left.  We dissected through the scar tissue exposing the thecal sac and the L1 and L2 nerve roots.  We performed foraminotomies about the bilateral L1 and L2 nerve roots with a Kerrison punches decompressing the nerve roots and thecal sac.  We now turned our attention to the posterior lumbar interbody fusion. I used a scalpel to incise the intervertebral disc at L1-2 bilaterally.  There was herniated disc bilaterally.. I then performed a partial intervertebral discectomy at 1 2 bilaterally using the pituitary forceps. We prepared the vertebral endplates at L1 to for the fusion by removing the soft tissues with the curettes. We then used the trial spacers to pick the appropriate sized interbody prosthesis. We prefilled his prosthesis with  a combination of local morselized autograft bone that we obtained during the decompression as well as  Zimmer DBM. We inserted the prefilled prosthesis into the interspace at L1-2, being careful to stay lateral to the thecal sac, from the right crossing the midline, we then  expanded the prosthesis. There was a good snug fit of the prosthesis in the interspace. We then filled and the remainder of the intervertebral disc space with local morselized autograft bone and Zimmer DBM. This completed the posterior lumbar interbody arthrodesis.  During the decompression and insertion of the prosthesis the assistant protected the thecal sac and nerve roots with the D'Errico retractor.  We now turned attention to the instrumentation. Under fluoroscopic guidance we cannulated the bilateral L1 pedicles with the bone probe. We then removed the bone probe. We then tapped the pedicle with a 6.5 millimeter tap. We then removed the tap. We probed inside the tapped pedicle with a ball probe to rule out cortical breaches. We then inserted a 7.5 x 55 millimeter pedicle screw into the L1 pedicles bilaterally under fluoroscopic guidance. We then palpated along the medial aspect of the pedicles to rule out cortical breaches. There were none. The nerve roots were not injured. We then connected the unilateral pedicle screws with a lordotic rod. We compressed the construct and secured the rod in place with the caps. We then tightened the caps appropriately.  We placed a cross connector between the rods.  This completed the instrumentation from L1-L5 bilaterally.  We now turned our attention to the posterior lateral arthrodesis at L1-2. We used the high-speed drill to decorticate the remainder of the facets, pars, transverse process at L1-2. We then applied a combination of local morselized autograft bone and Zimmer DBM over these decorticated posterior lateral structures. This completed the posterior lateral arthrodesis.  We then obtained hemostasis using bipolar electrocautery. We irrigated the wound out with saline solution. We  inspected the thecal sac and nerve roots and noted they were well decompressed. We then removed the retractor. We reapproximated patient's thoracolumbar fascia with interrupted #1 Vicryl suture. We reapproximated patient's subcutaneous tissue with interrupted 2-0 Vicryl suture. The reapproximated patient's skin with Steri-Strips and benzoin. The wound was then coated with bacitracin ointment. A sterile dressing was applied. The drapes were removed. The patient was subsequently returned to the supine position where they were extubated by the anesthesia team. He was then transported to the post anesthesia care unit in stable condition. All sponge instrument and needle counts were reportedly correct at the end of this case.

## 2022-07-20 NOTE — Transfer of Care (Signed)
Immediate Anesthesia Transfer of Care Note  Patient: [name]  Procedure(s) Performed: Posterior Lumbar Interbody Fusion ,Interbody Prothesis ,POSTERIOR INSTRUMENTATION Lumbar one-two;EXPLORE FUSION  Patient Location: PACU  Anesthesia Type:General  Level of Consciousness: awake, alert  and oriented  Airway & Oxygen Therapy: Patient Spontanous Breathing and Patient connected to face mask oxygen  Post-op Assessment: Report given to RN and Post -op Vital signs reviewed and stable  Post vital signs: Reviewed and stable  Last Vitals:  Vitals Value Taken Time  BP 187/93 07/20/22 1617  Temp    Pulse 90 07/20/22 1618  Resp 13 07/20/22 1618  SpO2 100 % 07/20/22 1618  Vitals shown include unvalidated device data.  Last Pain:  Vitals:   07/20/22 1106  TempSrc:   PainSc: 5       Patients Stated Pain Goal: 2 (07/20/22 1106)  Complications:  Encounter Notable Events  Notable Event Outcome Phase Comment  Difficult to intubate - expected  Intraprocedure Filed from anesthesia note documentation.

## 2022-07-21 NOTE — Progress Notes (Signed)
  Transition of Care [facility]) Screening Note   Patient Details  Name: George Johnston Date of Birth: Jul 18, 1941   Transition of Care [facility]) CM/SW Contact:    Kermit Balo, RN Phone Number: 07/21/2022, 2:24 PM   Pt is from home with spouse. He is s/p Posterior Lumbar Interbody Fusion ,Interbody Prothesis ,POSTERIOR INSTRUMENTATION Lumbar one-two;EXPLORE FUSION. No f/u per PT/OT.  Transition of Care Department [facility]) has reviewed patient and no TOC needs have been identified at this time. We will continue to monitor patient advancement through interdisciplinary progression rounds. If new patient transition needs arise, please place a TOC consult.

## 2022-07-21 NOTE — Progress Notes (Signed)
Foley dc'd per MD orders. Pt tolerated well, no bleeding or pain at the site noted

## 2022-07-21 NOTE — Evaluation (Signed)
Occupational Therapy Evaluation Patient Details Name: George Johnston MRN: [id number] DOB: 1941/11/02 Today's Date: 07/21/2022   History of Present Illness 81 y.o male s/p PLIF L1-2 on 07/20/2022. PMH significant for B Knee replacements, spinal surgery, arthritis, HTN, hypothyroidism, and RA.   Clinical Impression   PTA, pt was independent and lived with his wife, daughter, and grandchild. Upon evaluation, pt performing functional mobility and LB ADL with min guard A for safety. Pt educated and demonstrating LB dressing, shower transfer, bed mobility, oral care, and brace application within precautions. All questions answered, all education provided. Recommend discharge home with no OT follow up at this time. Re-consult if change in status.      Recommendations for follow up therapy are one component of a multi-disciplinary discharge planning process, led by the attending physician.  Recommendations may be updated based on patient status, additional functional criteria and insurance authorization.   Follow Up Recommendations  No OT follow up    Assistance Recommended at Discharge Intermittent Supervision/Assistance  Patient can return home with the following A little help with walking and/or transfers;A little help with bathing/dressing/bathroom;Assistance with cooking/housework;Help with stairs or ramp for entrance;Assist for transportation    Functional Status Assessment  Patient has had a recent decline in their functional status and demonstrates the ability to make significant improvements in function in a reasonable and predictable amount of time.  Equipment Recommendations  None recommended by OT (Pt has all recommended equipment)    Recommendations for Other Services PT consult     Precautions / Restrictions Precautions Precautions: Back Precaution Booklet Issued: Yes (comment) Precaution Comments: Pt following all precautions during session after initial education Required Braces  or Orthoses: Spinal Brace Spinal Brace: Lumbar corset Restrictions Weight Bearing Restrictions: No      Mobility Bed Mobility Overal bed mobility: Needs Assistance Bed Mobility: Rolling, Sidelying to Sit, Sit to Sidelying Rolling: Supervision Sidelying to sit: Supervision     Sit to sidelying: Min assist General bed mobility comments: Pt using bed rail and reporting he usually grabs onto furniture at home. Min A to bring RLE back into bed. Believe due to pt request for [facility] elevated before lying down.    Transfers Overall transfer level: Needs assistance Equipment used: None Transfers: Sit to/from Stand Sit to Stand: Min guard           General transfer comment: Min guard A for safety. Increased time, and pushing up from bed rail.      Balance Overall balance assessment: Needs assistance Sitting-balance support: No upper extremity supported, Feet supported, Feet unsupported Sitting balance-Leahy Scale: Good Sitting balance - Comments: Performig figure 4 position with distant supervision   Standing balance support: Single extremity supported, No upper extremity supported, During functional activity Standing balance-Leahy Scale: Fair Standing balance comment: Frequent "furniture surfing", however, ambulating with or without support with min guard. Standing in shower with no suppport ~1 minute with min guard                           ADL either performed or assessed with clinical judgement   ADL Overall ADL's : Needs assistance/impaired Eating/Feeding: Modified independent;Sitting   Grooming: Supervision/safety;Standing   Upper Body Bathing: Set up;Sitting   Lower Body Bathing: Min guard;Sit to/from stand   Upper Body Dressing : Set up;Sitting Upper Body Dressing Details (indicate cue type and reason): Donning brace with set-up Lower Body Dressing: Min guard;Sit to/from stand Lower Body Dressing Details (indicate  cue type and reason): Pt demonstrating  use of figure 4 position for LB dressing Toilet Transfer: Min guard;Ambulation;Comfort height toilet Toilet Transfer Details (indicate cue type and reason): Simulated in room     Tub/ Shower Transfer: Walk-in shower;Min guard;Ambulation Tub/Shower Transfer Details (indicate cue type and reason): Min guard for safety. Pt using handrails and aware of safety when entering/exiting shower Functional mobility during ADLs: Min guard General ADL Comments: Pt with frequent attempts to support self on surfaces; however, walking with min guard and no support when no surface nearby.     Vision Patient Visual Report: No change from baseline Vision Assessment?: No apparent visual deficits Additional Comments: Pt using cell phone when therapist entered room     Perception     Praxis      Pertinent Vitals/Pain Pain Assessment Pain Assessment: 0-10 Pain Score: 6  Pain Location: operative site Pain Descriptors / Indicators: Discomfort, Operative site guarding Pain Intervention(s): Limited activity within patient's tolerance, Monitored during session     Hand Dominance     Extremity/Trunk Assessment Upper Extremity Assessment Upper Extremity Assessment: Overall WFL for tasks assessed   Lower Extremity Assessment Lower Extremity Assessment: Defer to PT evaluation   Cervical / Trunk Assessment Cervical / Trunk Assessment: Back Surgery   Communication Communication Communication: No difficulties   Cognition Arousal/Alertness: Awake/alert Behavior During Therapy: WFL for tasks assessed/performed Overall Cognitive Status: Within Functional Limits for tasks assessed                                 General Comments: Pt pleasant and conversational throughout session.     General Comments  VSS. Pt reporting daughter and grandchild (19 y.o) live with him at this time and are available to assist as needed    Exercises     Shoulder Instructions      Home Living  Family/patient expects to be discharged to:: Private residence Living Arrangements: Spouse/significant other;Children;Other (Comment) (grandchild) Available Help at Discharge: Family Type of Home: House Home Access: Stairs to enter [employer] of Steps: 3 Entrance Stairs-Rails: Can reach both Home Layout: Able to live on main level with bedroom/bathroom;Two level Alternate Level Stairs-Number of Steps: 10 Alternate Level Stairs-Rails: Can reach both Bathroom Shower/Tub: Producer, television/film/video: Handicapped height     Home Equipment: Shower seat - built in;Hand held Programmer, systems (2 wheels);Cane - single point;BSC/3in1;Grab bars - toilet;Grab bars - tub/shower (shower head)   Additional Comments: Pt reporting he has a lot of equipment due to his wife being handicapped      Prior Functioning/Environment Prior Level of Function : Independent/Modified Independent             Mobility Comments: No AD ADLs Comments: Pt reports that he assists wife with bathing, performs cooking and cleaning. Pt additionally report he was driving and playing golf 3x per week prior to surgery.        OT Problem List: Decreased strength;Decreased activity tolerance;Impaired balance (sitting and/or standing);Decreased knowledge of precautions;Pain      OT Treatment/Interventions:      OT Goals(Current goals can be found in the care plan section) Acute Rehab OT Goals Patient Stated Goal: Stay here one more night, then return home OT Goal Formulation: With patient  OT Frequency:      Co-evaluation              AM-PAC OT "6 Clicks" Daily Activity  Outcome Measure Help from another person eating meals?: None Help from another person taking care of personal grooming?: A Little Help from another person toileting, which includes using toliet, bedpan, or urinal?: A Little Help from another person bathing (including washing, rinsing, drying)?: A Little Help  from another person to put on and taking off regular upper body clothing?: A Little Help from another person to put on and taking off regular lower body clothing?: A Little 6 Click Score: 19   End of Session Equipment Utilized During Treatment: Gait belt Nurse Communication: Mobility status  Activity Tolerance: Patient tolerated treatment well Patient left: in bed;with call bell/phone within reach  OT Visit Diagnosis: Unsteadiness on feet (R26.81);Muscle weakness (generalized) (M62.81);Pain Pain - part of body:  (operative site)                Time: 9672-8979 OT Time Calculation (min): 22 min Charges:  OT General Charges $OT Visit: 1 Visit OT Evaluation $OT Eval Low Complexity: 1 Low  Ladene Artist, OTR/L [facility] Acute Rehabilitation Office: [phone]   Drue Novel 07/21/2022, 9:09 AM

## 2022-07-21 NOTE — Progress Notes (Signed)
Pt ambulated at bedside, pain better controlled at this time, denies numbness/tingling, no dizziness or lightheadedness noted. Pt due to void, call bell in reach, will monitor

## 2022-07-21 NOTE — Evaluation (Signed)
Physical Therapy Evaluation and discharge Patient Details Name: George Johnston MRN: [id number] DOB: 1941-09-16 Today's Date: 07/21/2022  History of Present Illness  81 y.o male s/p PLIF L1-2 on 07/20/2022. PMH significant for B Knee replacements, spinal surgery, arthritis, HTN, hypothyroidism, and RA.  Clinical Impression   Patient evaluated by Physical Therapy with no further acute PT needs identified. All education has been completed and the patient has no further questions.  PT is signing off. Thank you for this referral.        Recommendations for follow up therapy are one component of a multi-disciplinary discharge planning process, led by the attending physician.  Recommendations may be updated based on patient status, additional functional criteria and insurance authorization.  Follow Up Recommendations No PT follow up      Assistance Recommended at Discharge Set up Supervision/Assistance  Patient can return home with the following       Equipment Recommendations None recommended by PT  Recommendations for Other Services       Functional Status Assessment Patient has not had a recent decline in their functional status     Precautions / Restrictions Precautions Precautions: Back Precaution Booklet Issued: Yes (comment) Precaution Comments: pt able to verbalize 3/3 and adhere to all precautions Required Braces or Orthoses: Spinal Brace Spinal Brace: Lumbar corset Restrictions Weight Bearing Restrictions: No      Mobility  Bed Mobility Overal bed mobility: Needs Assistance Bed Mobility: Rolling, Sidelying to Sit, Sit to Sidelying Rolling: Supervision Sidelying to sit: Supervision       General bed mobility comments: Pt using bed rail and reporting he usually grabs onto furniture at home.    Transfers Overall transfer level: Needs assistance Equipment used: None Transfers: Sit to/from Stand Sit to Stand: Supervision           General transfer comment: no  imbalance noted    Ambulation/Gait Ambulation/Gait assistance: Min guard, Supervision Gait Distance (Feet): 250 Feet Assistive device: None Gait Pattern/deviations: WFL(Within Functional Limits)       General Gait Details: pt with occasional use of hand rail; denies feeling off balance, just using "because it's there"  Stairs Stairs: Yes Stairs assistance: Modified independent (Device/Increase time) Stair Management: Two rails, Alternating pattern, Forwards Number of Stairs: 5    Wheelchair Mobility    Modified Rankin (Stroke Patients Only)       Balance Overall balance assessment: Needs assistance Sitting-balance support: No upper extremity supported, Feet supported, Feet unsupported Sitting balance-Leahy Scale: Good Sitting balance - Comments: Performig figure 4 position with distant supervision   Standing balance support: Single extremity supported, No upper extremity supported, During functional activity Standing balance-Leahy Scale: Fair Standing balance comment: Frequent "furniture surfing", however, ambulating with or without support with supervision.                             Pertinent Vitals/Pain Pain Assessment Pain Assessment: 0-10 Pain Score: 6  Pain Location: operative site Pain Descriptors / Indicators: Discomfort, Operative site guarding Pain Intervention(s): Limited activity within patient's tolerance, Monitored during session    Home Living Family/patient expects to be discharged to:: Private residence Living Arrangements: Spouse/significant other;Children;Other (Comment) (grandchild) Available Help at Discharge: Family Type of Home: House Home Access: Stairs to enter Entrance Stairs-Rails: Can reach both Entrance Stairs-Number of Steps: 3 Alternate Level Stairs-Number of Steps: 10 Home Layout: Able to live on main level with bedroom/bathroom;Two level Home Equipment: Shower seat - built in;Hand  held shower head;Rolling Walker (2  wheels);Cane - single point;BSC/3in1;Grab bars - toilet;Grab bars - tub/shower (shower head) Additional Comments: Pt reporting he has a lot of equipment due to his wife being handicapped    Prior Function Prior Level of Function : Independent/Modified Independent             Mobility Comments: No AD ADLs Comments: Pt reports that he assists wife with bathing, performs cooking and cleaning. Pt additionally report he was driving and playing golf 3x per week prior to surgery.     Hand Dominance        Extremity/Trunk Assessment   Upper Extremity Assessment Upper Extremity Assessment: Defer to OT evaluation    Lower Extremity Assessment Lower Extremity Assessment: Overall WFL for tasks assessed    Cervical / Trunk Assessment Cervical / Trunk Assessment: Back Surgery  Communication   Communication: No difficulties  Cognition Arousal/Alertness: Awake/alert Behavior During Therapy: WFL for tasks assessed/performed Overall Cognitive Status: Within Functional Limits for tasks assessed                                 General Comments: Pt pleasant and conversational throughout session.        General Comments General comments (skin integrity, edema, etc.): VSS. Pt reporting daughter and grandchild (27 y.o) live with him at this time and are available to assist as needed    Exercises     Assessment/Plan    PT Assessment Patient does not need any further PT services  PT Problem List         PT Treatment Interventions      PT Goals (Current goals can be found in the Care Plan section)  Acute Rehab PT Goals Patient Stated Goal: go home tomorrow PT Goal Formulation: All assessment and education complete, DC therapy    Frequency       Co-evaluation               AM-PAC PT "6 Clicks" Mobility  Outcome Measure Help needed turning from your back to your side while in a flat bed without using bedrails?: A Little Help needed moving from lying on  your back to sitting on the side of a flat bed without using bedrails?: A Little Help needed moving to and from a bed to a chair (including a wheelchair)?: A Little Help needed standing up from a chair using your arms (e.g., wheelchair or bedside chair)?: A Little Help needed to walk in hospital room?: A Little Help needed climbing 3-5 steps with a railing? : None 6 Click Score: 19    End of Session Equipment Utilized During Treatment: Gait belt Activity Tolerance: Patient tolerated treatment well Patient left: in bed;with call bell/phone within reach Nurse Communication: Mobility status PT Visit Diagnosis: Pain Pain - Right/Left:  (midline) Pain - part of body:  (back)    Time: 1624-4695 PT Time Calculation (min) (ACUTE ONLY): 20 min   Charges:   PT Evaluation $PT Eval Low Complexity: 1 Low           Jerolyn Center, PT Acute Rehabilitation Services  Office [phone]   Zena Amos 07/21/2022, 11:36 AM

## 2022-07-21 NOTE — Progress Notes (Signed)
Subjective: The patient is alert and pleasant.  He is in no apparent distress.  He wants to go home tomorrow.  He tells me he feels better already.  Tells me he was having a lot of urinary urgency prior to surgery.  He feels this is better already but has not urinated since taking the catheter out.  Objective: Vital signs in last 24 hours: Temp:  [98 F (36.7 C)-98.7 F (37.1 C)] 98.2 F (36.8 C) (08/10 0746) Pulse Rate:  [76-96] 76 (08/10 0746) Resp:  [8-18] 18 (08/10 0424) BP: (102-195)/(56-98) 105/56 (08/10 0746) SpO2:  [92 %-100 %] 96 % (08/10 0746) Estimated body mass index is 20.67 kg/m as calculated from the following:   Height as of this encounter: 5\' 9"  (1.753 m).   Weight as of this encounter: 63.5 kg.   Intake/Output from previous day: 08/09 0701 - 08/10 0700 In: 1563.5 [I.V.:1368.8; IV Piggyback:194.7] Out: 940 [Urine:740; Blood:200] Intake/Output this shift: No intake/output data recorded.  Physical exam the patient is alert and pleasant.  His lower extremity strength is grossly normal.  Lab Results: No results for input(s): "WBC", "HGB", "HCT", "PLT" in the last 72 hours. BMET No results for input(s): "NA", "K", "CL", "CO2", "GLUCOSE", "BUN", "CREATININE", "CALCIUM" in the last 72 hours.  Studies/Results: DG Lumbar Spine 2-3 Views  Result Date: 07/20/2022 CLINICAL DATA:  Surgical posterior fusion of L1-2. EXAM: LUMBAR SPINE - 2-3 VIEW; DG C-ARM 1-60 MIN-NO REPORT Radiation exposure index: 6.14 mGy. COMPARISON:  December 24, 2021. FINDINGS: Two intraoperative fluoroscopic images were obtained of the thoracolumbar spine. These images demonstrate intrapedicular screw placement of L1 as well as interbody spacer at L1-2. IMPRESSION: Fluoroscopic guidance provided during surgical posterior fusion of L1-2. Electronically Signed   By: December 26, 2021 M.D.   On: 07/20/2022 15:37   DG C-Arm 1-60 Min-No Report  Result Date: 07/20/2022 Fluoroscopy was utilized by the requesting  physician.  No radiographic interpretation.    Assessment/Plan: Postop day 1: The patient is doing well.  We will mobilize him with PT and OT.  We will tentatively plan to send him home tomorrow.  LOS: 1 day     09/19/2022 07/21/2022, 11:04 AM    Patient ID: 09/20/2022, male   DOB: 1941-08-13, 81 y.o.   MRN: [id number]

## 2022-07-22 MED ORDER — OXYCODONE-ACETAMINOPHEN 5-325 MG PO TABS: 1.0000 | ORAL_TABLET | ORAL | 0 refills | Status: DC | PRN

## 2022-07-22 MED ORDER — OXYCODONE-ACETAMINOPHEN 5-325 MG PO TABS: 1.0000 | ORAL_TABLET | ORAL | Status: DC | PRN

## 2022-07-22 MED ORDER — DOCUSATE SODIUM 100 MG PO CAPS: 100.0000 mg | ORAL_CAPSULE | Freq: Two times a day (BID) | ORAL | 0 refills | Status: DC

## 2022-07-22 NOTE — Plan of Care (Signed)

## 2022-07-22 NOTE — Discharge Summary (Signed)
Physician Discharge Summary  Patient ID: George Johnston MRN: [id number] DOB/AGE: [birth year] 81 y.o.  Admit date: 07/20/2022 Discharge date: 07/22/2022  Admission Diagnoses: Lumbar adjacent disease with spondylolisthesis, lumbar spinal stenosis, lumbar herniated disc, lumbar degenerative disease, neurogenic claudication, lumbago, lumbar radiculopathy  Discharge Diagnoses: The same Principal Problem:   Spinal stenosis of lumbar region with neurogenic claudication   Discharged Condition: good  Hospital Course: Performed extension of the patient's lumbar fusion to L1-2 on 07/20/2022.  The surgery went well.  The patient's postoperative course was unremarkable.  On postoperative day #2 he requested discharge to home.  He was given verbal and written discharge instructions.  All his questions were answered.  Consults: PT, OT, care management Significant Diagnostic Studies: None Treatments: Exploration of lumbar fusion, L1-2 decompression, instrumentation and fusion. Discharge Exam: Blood pressure (!) 156/85, pulse 77, temperature 97.7 F (36.5 C), temperature source Oral, resp. rate 16, height 5\' 9"  (1.753 m), weight 63.5 kg, SpO2 98 %. Patient is alert and pleasant.  His he has a bloodstain.  His strength is normal.  He looks well.  Disposition: Home  Discharge Instructions     Call MD for:  difficulty breathing, headache or visual disturbances   Complete by: As directed    Call MD for:  extreme fatigue   Complete by: As directed    Call MD for:  hives   Complete by: As directed    Call MD for:  persistant dizziness or light-headedness   Complete by: As directed    Call MD for:  persistant nausea and vomiting   Complete by: As directed    Call MD for:  redness, tenderness, or signs of infection (pain, swelling, redness, odor or green/yellow discharge around incision site)   Complete by: As directed    Call MD for:  severe uncontrolled pain   Complete by: As directed    Call MD for:   temperature >100.4   Complete by: As directed    Diet - low sodium heart healthy   Complete by: As directed    Discharge instructions   Complete by: As directed    Call [phone] for a followup appointment. Take a stool softener while you are using pain medications.   Driving Restrictions   Complete by: As directed    Do not drive for 2 weeks.   Increase activity slowly   Complete by: As directed    Lifting restrictions   Complete by: As directed    Do not lift more than 5 pounds. No excessive bending or twisting.   May shower / Bathe   Complete by: As directed    Remove the dressing for 3 days after surgery.  You may shower, but leave the incision alone.   Remove dressing in 24 hours   Complete by: As directed       Allergies as of 07/22/2022       Reactions   Erythromycin Nausea Only   Erythromycin Base Nausea Only   Gabapentin Nausea Only        Medication List     STOP taking these medications    HYDROcodone-acetaminophen 10-325 MG tablet Commonly known as: NORCO       TAKE these medications    carboxymethylcellulose 0.5 % Soln Commonly known as: REFRESH PLUS Place 1 drop into both eyes 3 (three) times daily as needed (dry eyes).   chlorthalidone 25 MG tablet Commonly known as: HYGROTON Take 25 mg by mouth daily.   cholecalciferol 25 MCG (  1000 UNIT) tablet Commonly known as: VITAMIN D3 Take 1,000 Units by mouth daily.   docusate sodium 100 MG capsule Commonly known as: COLACE Take 1 capsule (100 mg total) by mouth 2 (two) times daily.   folic acid 1 MG tablet Commonly known as: FOLVITE Take 1 mg by mouth in the morning and at bedtime.   leflunomide 20 MG tablet Commonly known as: ARAVA Take 20 mg by mouth daily.   levothyroxine 112 MCG tablet Commonly known as: SYNTHROID Take 112 mcg by mouth daily before breakfast.   lisinopril 40 MG tablet Commonly known as: ZESTRIL Take 40 mg by mouth daily.   methocarbamol 750 MG tablet Commonly  known as: ROBAXIN Take 750 mg by mouth in the morning and at bedtime.   omeprazole 20 MG capsule Commonly known as: PRILOSEC Take 20 mg by mouth daily as needed (acid reflux).   oxyCODONE-acetaminophen 5-325 MG tablet Commonly known as: PERCOCET/ROXICET Take 1-2 tablets by mouth every 4 (four) hours as needed for moderate pain.   predniSONE 5 MG tablet Commonly known as: DELTASONE Take 5 mg by mouth daily with breakfast.   promethazine 25 MG tablet Commonly known as: PHENERGAN Take 25 mg by mouth in the morning, at noon, in the evening, and at bedtime.   rosuvastatin 20 MG tablet Commonly known as: CRESTOR Take 20 mg by mouth daily.   sertraline 100 MG tablet Commonly known as: ZOLOFT Take 100 mg by mouth daily.   tamsulosin 0.4 MG Caps capsule Commonly known as: FLOMAX Take 0.4 mg by mouth daily.   traZODone 100 MG tablet Commonly known as: DESYREL Take 100 mg by mouth at bedtime.         Signed: Cristi Loron 07/22/2022, 6:52 AM

## 2022-07-22 NOTE — Progress Notes (Signed)
Discharge instructions provided. All medications, follow up appointments, and discharge instructions provided. IV out. Monitor off CCMD notified. Honeycomb dressing changed. Discharging to home.  Sabra Heck, RN

## 2022-07-25 MED FILL — Heparin Sodium (Porcine) Inj 1000 Unit/ML: INTRAMUSCULAR | Qty: 30 | Status: AC

## 2022-07-25 MED FILL — Sodium Chloride IV Soln 0.9%: INTRAVENOUS | Qty: 1000 | Status: AC

## 2023-10-15 ENCOUNTER — Emergency Department (HOSPITAL_BASED_OUTPATIENT_CLINIC_OR_DEPARTMENT_OTHER)
Admission: EM | Admit: 2023-10-15 | Discharge: 2023-10-15 | Disposition: A | Discharge status: Home / Self Care | Payer: Medicare Other

## 2023-10-15 ENCOUNTER — Encounter (HOSPITAL_BASED_OUTPATIENT_CLINIC_OR_DEPARTMENT_OTHER): Payer: Self-pay | Admitting: Emergency Medicine

## 2023-10-15 ENCOUNTER — Other Ambulatory Visit: Payer: Self-pay

## 2023-10-15 ENCOUNTER — Emergency Department (HOSPITAL_BASED_OUTPATIENT_CLINIC_OR_DEPARTMENT_OTHER): Payer: Medicare Other

## 2023-10-15 DIAGNOSIS — M5416 Radiculopathy, lumbar region: Secondary | ICD-10-CM | POA: Diagnosis not present

## 2023-10-15 DIAGNOSIS — Z79899 Other long term (current) drug therapy: Secondary | ICD-10-CM | POA: Diagnosis not present

## 2023-10-15 DIAGNOSIS — M549 Dorsalgia, unspecified: Secondary | ICD-10-CM | POA: Diagnosis present

## 2023-10-15 DIAGNOSIS — I10 Essential (primary) hypertension: Secondary | ICD-10-CM | POA: Diagnosis not present

## 2023-10-15 MED ORDER — KETOROLAC TROMETHAMINE 15 MG/ML IJ SOLN
15.0000 mg | Freq: Once | INTRAMUSCULAR | Status: AC
  Administered 2023-10-15: 15 mg via INTRAMUSCULAR
  Filled 2023-10-15: qty 1

## 2023-10-15 MED ORDER — OXYCODONE HCL 20 MG PO TABS: 20.0000 mg | ORAL_TABLET | Freq: Four times a day (QID) | ORAL | 0 refills | Status: DC

## 2023-10-15 MED ORDER — OXYCODONE HCL 5 MG PO TABS
20.0000 mg | ORAL_TABLET | Freq: Once | ORAL | Status: AC
  Administered 2023-10-15: 20 mg via ORAL
  Filled 2023-10-15: qty 4

## 2023-10-15 NOTE — Discharge Instructions (Signed)
You do have a disc bulge on your CT scan.  Please follow-up with your neurosurgeon to discuss this further.  I did send a prescription for your oxycodone to the pharmacy.  They [facility] I will allow a 3-day supply.  Please call your primary care doctor tomorrow to schedule a follow-up appointment to explain your situation.  You may also try Voltaren gel which is over-the-counter as well as lidocaine patches over-the-counter to see if this helps in addition to the other medications you are on.  Please return to the ER for worsening symptoms.

## 2023-10-15 NOTE — ED Notes (Signed)
Patient transported to CT 

## 2023-10-15 NOTE — ED Provider Notes (Signed)
[place] EMERGENCY DEPARTMENT AT [facility] Provider Note   CSN: [id number] Arrival date & time: 10/15/23  1539     History  Chief Complaint  Patient presents with   Back Pain    George Johnston is a 82 y.o. male.  82 year old male with past medical history of chronic back pain with spinal nerve stimulator presenting to the emergency department today with back pain.  The patient states that he has been on pain medication in the past for this.  He states that his wife died at the end of 08/17/2023 and she would help him keep up with his medications.  He states that he has not been able to find this now for the past week or so.  The patient did have a mechanical fall 1 week ago and states that his back pain is worse since then.  He denies any bowel or bladder dysfunction and has not had any saddle anesthesia.  He denies chest pain or shortness of breath.  Came to the emergency department due to the pain.  He denies any fevers or chills.        Home Medications Prior to Admission medications   Medication Sig Start Date End Date Taking? Authorizing Provider  Oxycodone HCl 20 MG TABS Take 1 tablet (20 mg total) by mouth every 6 (six) hours. 10/15/23  Yes Durwin Glaze, MD  carboxymethylcellulose (REFRESH PLUS) 0.5 % SOLN Place 1 drop into both eyes 3 (three) times daily as needed (dry eyes).    [provider]  chlorthalidone (HYGROTON) 25 MG tablet Take 25 mg by mouth daily.    [provider]  cholecalciferol (VITAMIN D3) 25 MCG (1000 UNIT) tablet Take 1,000 Units by mouth daily.    [provider]  docusate sodium (COLACE) 100 MG capsule Take 1 capsule (100 mg total) by mouth 2 (two) times daily. 08-16-22   Tressie Stalker, MD  folic acid (FOLVITE) 1 MG tablet Take 1 mg by mouth in the morning and at bedtime.    [provider]  leflunomide (ARAVA) 20 MG tablet Take 20 mg by mouth daily.    [provider]  levothyroxine (SYNTHROID)  112 MCG tablet Take 112 mcg by mouth daily before breakfast.    [provider]  lisinopril (ZESTRIL) 40 MG tablet Take 40 mg by mouth daily.    [provider]  methocarbamol (ROBAXIN) 750 MG tablet Take 750 mg by mouth in the morning and at bedtime.    [provider]  omeprazole (PRILOSEC) 20 MG capsule Take 20 mg by mouth daily as needed (acid reflux).    [provider]  oxyCODONE-acetaminophen (PERCOCET/ROXICET) 5-325 MG tablet Take 1-2 tablets by mouth every 4 (four) hours as needed for moderate pain. 08/16/22   Tressie Stalker, MD  predniSONE (DELTASONE) 5 MG tablet Take 5 mg by mouth daily with breakfast.    [provider]  promethazine (PHENERGAN) 25 MG tablet Take 25 mg by mouth in the morning, at noon, in the evening, and at bedtime.    [provider]  rosuvastatin (CRESTOR) 20 MG tablet Take 20 mg by mouth daily.    [provider]  sertraline (ZOLOFT) 100 MG tablet Take 100 mg by mouth daily.    [provider]  tamsulosin (FLOMAX) 0.4 MG CAPS capsule Take 0.4 mg by mouth daily.    [provider]  traZODone (DESYREL) 100 MG tablet Take 100 mg by mouth at bedtime.  [provider]      Allergies    Erythromycin and Erythromycin base    Review of Systems   Review of Systems  Musculoskeletal:  Positive for back pain.  Neurological:  Negative for weakness and numbness.  All other systems reviewed and are negative.   Physical Exam Updated Vital Signs BP (!) 150/88   Pulse 85   Temp 98.3 F (36.8 C)   Resp 20   Wt 63.5 kg   SpO2 99%   BMI 20.67 kg/m  Physical Exam Vitals and nursing note reviewed.   Gen: NAD Eyes: PERRL, EOMI HEENT: no oropharyngeal swelling Neck: trachea midline Resp: clear to auscultation bilaterally Card: RRR, no murmurs, rubs, or gallops Abd: nontender, nondistended Extremities: no calf tenderness, no edema Vascular: 2+ radial pulses bilaterally, 2+  DP pulses bilaterally MSK: Tender over the mid lumbar spine with the right and left paraspinal tenderness noted, no step-offs or deformities Neuro: Equal strength sensation throughout bilateral lower extremities with normal patellar and Achilles reflexes bilaterally Skin: no rashes Psyc: acting appropriately   ED Results / Procedures / Treatments   Labs (all labs ordered are listed, but only abnormal results are displayed) Labs Reviewed - No data to display  EKG None  Radiology CT Lumbar Spine Wo Contrast  Result Date: 10/15/2023 CLINICAL DATA:  Chronic back pain status post multiple surgeries EXAM: CT LUMBAR SPINE WITHOUT CONTRAST TECHNIQUE: Multidetector CT imaging of the lumbar spine was performed without intravenous contrast administration. Multiplanar CT image reconstructions were also generated. RADIATION DOSE REDUCTION: This exam was performed according to the departmental dose-optimization program which includes automated exposure control, adjustment of the mA and/or kV according to patient size and/or use of iterative reconstruction technique. COMPARISON:  Lumbar spine radiographs 07/20/2022, lumbar spine CT 04/20/2022 FINDINGS: Segmentation: Standard; the lowest formed disc space is designated L5-S1. Alignment: There is mild dextrocurvature centered at L2-L3, 3 mm fixed grade 1 anterolisthesis of L4 on L5, and 4 mm grade 1 anterolisthesis of L5 on S1 with associated bilateral pars defects, all unchanged since the prior CT. Vertebrae: Vertebral body heights are preserved, without evidence of acute fracture. There is no suspicious osseous lesion. Postsurgical changes reflecting L1 through L5 posterior instrumented fusion are again noted with posterior decompression at L1-L2 on on the right and bilaterally at L2-L3 through L4-L5. Compared to the prior myelogram from 04/20/2022, the construct has been extended superiorly to the L1 level in the interval. There is evidence of partial osseous  fusion across the posterior elements at L2-L3 on the right, and more solid fusion across the posterior elements bilaterally at L3-L4 and L4-L5. There is at least partial osseous fusion across the disc spaces at the surgical levels. Perihardware lucency or other evidence of hardware related complication. Paraspinal and other soft tissues: Spinal stimulator leads are noted entering the thecal sac at the T11-T12 level. The paraspinal soft tissues are otherwise unremarkable. Disc levels: The spinal canal is suboptimally assessed due to streak artifact from the hardware; however, there is no convincing high-grade spinal canal stenosis. Endplate spurring at L1-L2 resulting in probable moderate left and mild right neural foraminal stenosis. At L5-S1, there is grade 1 anterolisthesis with diffuse disc bulge and vacuum disc phenomenon, endplate spurring, and moderate facet arthropathy with bilateral pars defects resulting in severe bilateral neural foraminal stenosis and right subarticular zone effacement with likely impingement of the traversing S1 nerve root (4-106). The right subarticular zone effacement is worsened since 2023. IMPRESSION: 1. No acute finding. 2.  Status post posterior instrumented fusion at L1 through L5 without evidence of complication or convincing spinal canal stenosis. 3. 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 Electronically Signed   By: Lesia Hausen M.D.   On: 10/15/2023 17:32    Procedures Procedures    Medications Ordered in ED Medications  oxyCODONE (Oxy IR/ROXICODONE) immediate release tablet 20 mg (20 mg Oral Given 10/15/23 1635)  ketorolac (TORADOL) 15 MG/ML injection 15 mg (15 mg Intramuscular Given 10/15/23 1834)    ED Course/ Medical Decision Making/ A&P                                  Medical Decision Making 82 year old male with past medical history of hypertension and chronic low back pain presenting to the emergency department today with worsening low back pain after a fall and being unable to find his chronic pain medications.  I will give the patient a dose of his home OxyContin here.  I will obtain a CT scan of his lumbar spine to evaluate for compression deformity or acute injury from his recent fall.  The patient's blood pressure is elevated here but he is denying any symptoms of endorgan dysfunction at this time.  I suspect this is due to pain.  Suspicion for aortic pathology is low as his pain is reproducible here on exam and this it does seem to be a chronic issue for the patient.  The patient CT scan does show findings concerning for possible disc bulge.  He is ambulatory here.  I will refill his pain medication.  I did call and discussed this with the pharmacist.  Will give a 3-day supply until he is able to get in touch with his primary care provider.  He will be discharged with return precautions.  Amount and/or Complexity of Data Reviewed Radiology: ordered.  Risk Prescription drug management.           Final Clinical Impression(s) / ED Diagnoses Final diagnoses:  Lumbar radiculopathy    Rx / DC Orders ED Discharge Orders          Ordered    Oxycodone HCl 20 MG TABS  Every 6 hours        10/15/23 1919              Durwin Glaze, MD 10/15/23 1921

## 2023-10-15 NOTE — ED Triage Notes (Signed)
Pt has chronic back pain, has had 4 surgeries in the past. He had stimulator placed in January with no relief. Has only worsened.

## 2023-11-07 ENCOUNTER — Other Ambulatory Visit: Payer: Self-pay | Admitting: Neurosurgery

## 2023-11-15 ENCOUNTER — Other Ambulatory Visit: Payer: Self-pay | Admitting: Neurosurgery

## 2023-11-24 NOTE — Pre-Procedure Instructions (Signed)
Surgical Instructions   Your procedure is scheduled on Thursday, December 19th. Report to [facility] Main Entrance "A" at 08:30 A.M., then check in with the Admitting office. Any questions or running late day of surgery: call [phone]  Questions prior to your surgery date: call [phone], Monday-Friday, 8am-4pm. If you experience any cold or flu symptoms such as cough, fever, chills, shortness of breath, etc. between now and your scheduled surgery, please notify us at the above number.     Remember:  Do not eat or drink after midnight the night before your surgery     Take these medicines the morning of surgery with A SIP OF WATER  levothyroxine (SYNTHROID)  methocarbamol (ROBAXIN)  Oxycodone HCl  predniSONE (DELTASONE)  promethazine (PHENERGAN)  rosuvastatin (CRESTOR)  sertraline (ZOLOFT)  tamsulosin [facility])    May take these medicines IF NEEDED: carboxymethylcellulose (REFRESH PLUS) eye drops omeprazole (PRILOSEC)  oxyCODONE-acetaminophen (PERCOCET/ROXICET)    Follow your prescriber's instructions regarding leflunomide (ARAVA).    One week prior to surgery, STOP taking any Aspirin (unless otherwise instructed by your surgeon) Aleve, Naproxen, Ibuprofen, Motrin, Advil, Goody's, BC's, all herbal medications, fish oil, and non-prescription vitamins.                     Do NOT Smoke (Tobacco/Vaping) for 24 hours prior to your procedure.  If you use a CPAP at night, you may bring your mask/headgear for your overnight stay.   You will be asked to remove any contacts, glasses, piercing's, hearing aid's, dentures/partials prior to surgery. Please bring cases for these items if needed.    Patients discharged the day of surgery will not be allowed to drive home, and someone needs to stay with them for 24 hours.  SURGICAL WAITING ROOM VISITATION Patients may have no more than 2 support people in the waiting area - these visitors may rotate.   Pre-op nurse will  coordinate an appropriate time for 1 ADULT support person, who may not rotate, to accompany patient in pre-op.  Children under the age of 70 must have an adult with them who is not the patient and must remain in the main waiting area with an adult.  If the patient needs to stay at the hospital during part of their recovery, the visitor guidelines for inpatient rooms apply.  Please refer to the [facility] website for the visitor guidelines for any additional information.   If you received a COVID test during your pre-op visit  it is requested that you wear a mask when out in public, stay away from anyone that may not be feeling well and notify your surgeon if you develop symptoms. If you have been in contact with anyone that has tested positive in the last 10 days please notify you surgeon.      Pre-operative 5 CHG Bathing Instructions   You can play a key role in reducing the risk of infection after surgery. Your skin needs to be as free of germs as possible. You can reduce the number of germs on your skin by washing with CHG (chlorhexidine gluconate) soap before surgery. CHG is an antiseptic soap that kills germs and continues to kill germs even after washing.   DO NOT use if you have an allergy to chlorhexidine/CHG or antibacterial soaps. If your skin becomes reddened or irritated, stop using the CHG and notify one of our RNs at [phone].   Please shower with the CHG soap starting 4 days before surgery using the following  schedule:     Please keep in mind the following:  DO NOT shave, including legs and underarms, starting the day of your first shower.   You may shave your face at any point before/day of surgery.  Place clean sheets on your bed the day you start using CHG soap. Use a clean washcloth (not used since being washed) for each shower. DO NOT sleep with pets once you start using the CHG.   CHG Shower Instructions:  Wash your face and private area with normal soap. If  you choose to wash your hair, wash first with your normal shampoo.  After you use shampoo/soap, rinse your hair and body thoroughly to remove shampoo/soap residue.  Turn the water OFF and apply about 3 tablespoons (45 ml) of CHG soap to a CLEAN washcloth.  Apply CHG soap ONLY FROM YOUR NECK DOWN TO YOUR TOES (washing for 3-5 minutes)  DO NOT use CHG soap on face, private areas, open wounds, or sores.  Pay special attention to the area where your surgery is being performed.  If you are having back surgery, having someone wash your back for you may be helpful. Wait 2 minutes after CHG soap is applied, then you may rinse off the CHG soap.  Pat dry with a clean towel  Put on clean clothes/pajamas   If you choose to wear lotion, please use ONLY the CHG-compatible lotions on the back of this paper.   Additional instructions for the day of surgery: DO NOT APPLY any lotions, deodorants, cologne, or perfumes.   Do not bring valuables to the hospital. [facility] is not responsible for any belongings/valuables. Do not wear nail polish, gel polish, artificial nails, or any other type of covering on natural nails (fingers and toes) Do not wear jewelry or makeup Put on clean/comfortable clothes.  Please brush your teeth.  Ask your nurse before applying any prescription medications to the skin.     CHG Compatible Lotions   Aveeno Moisturizing lotion  Cetaphil Moisturizing Cream  Cetaphil Moisturizing Lotion  Clairol Herbal Essence Moisturizing Lotion, Dry Skin  Clairol Herbal Essence Moisturizing Lotion, Extra Dry Skin  Clairol Herbal Essence Moisturizing Lotion, Normal Skin  Curel Age Defying Therapeutic Moisturizing Lotion with Alpha Hydroxy  Curel Extreme Care Body Lotion  Curel Soothing Hands Moisturizing Hand Lotion  Curel Therapeutic Moisturizing Cream, Fragrance-Free  Curel Therapeutic Moisturizing Lotion, Fragrance-Free  Curel Therapeutic Moisturizing Lotion, Original Formula  Eucerin  Daily Replenishing Lotion  Eucerin Dry Skin Therapy Plus Alpha Hydroxy Crme  Eucerin Dry Skin Therapy Plus Alpha Hydroxy Lotion  Eucerin Original Crme  Eucerin Original Lotion  Eucerin Plus Crme Eucerin Plus Lotion  Eucerin TriLipid Replenishing Lotion  Keri Anti-Bacterial Hand Lotion  Keri Deep Conditioning Original Lotion Dry Skin Formula Softly Scented  Keri Deep Conditioning Original Lotion, Fragrance Free Sensitive Skin Formula  Keri Lotion Fast Absorbing Fragrance Free Sensitive Skin Formula  Keri Lotion Fast Absorbing Softly Scented Dry Skin Formula  Keri Original Lotion  Keri Skin Renewal Lotion Keri Silky Smooth Lotion  Keri Silky Smooth Sensitive Skin Lotion  Nivea Body Creamy Conditioning Oil  Nivea Body Extra Enriched Lotion  Nivea Body Original Lotion  Nivea Body Sheer Moisturizing Lotion Nivea Crme  Nivea Skin Firming Lotion  NutraDerm 30 Skin Lotion  NutraDerm Skin Lotion  NutraDerm Therapeutic Skin Cream  NutraDerm Therapeutic Skin Lotion  ProShield Protective Hand Cream  Provon moisturizing lotion  Please read over the following fact sheets that you were given.

## 2023-11-27 ENCOUNTER — Encounter (HOSPITAL_COMMUNITY)
Admission: RE | Admit: 2023-11-27 | Discharge: 2023-11-27 | Disposition: A | Discharge status: Home / Self Care | Payer: Medicare Other | Source: Ambulatory Visit | Attending: Neurosurgery | Admitting: Neurosurgery

## 2023-11-27 ENCOUNTER — Encounter (HOSPITAL_COMMUNITY): Payer: Self-pay

## 2023-11-27 ENCOUNTER — Telehealth: Payer: Self-pay | Admitting: Physician Assistant

## 2023-11-27 ENCOUNTER — Other Ambulatory Visit: Payer: Self-pay

## 2023-11-27 VITALS — BP 133/53 | HR 45 | Temp 98.2°F | Resp 17 | Ht 70.0 in | Wt 139.0 lb

## 2023-11-27 DIAGNOSIS — Z01818 Encounter for other preprocedural examination: Secondary | ICD-10-CM | POA: Insufficient documentation

## 2023-11-27 DIAGNOSIS — E039 Hypothyroidism, unspecified: Secondary | ICD-10-CM | POA: Insufficient documentation

## 2023-11-27 DIAGNOSIS — I517 Cardiomegaly: Secondary | ICD-10-CM | POA: Insufficient documentation

## 2023-11-27 DIAGNOSIS — R001 Bradycardia, unspecified: Secondary | ICD-10-CM | POA: Diagnosis not present

## 2023-11-27 LAB — CBC
HCT: 34 % — ABNORMAL LOW (ref 39.0–52.0)
Hemoglobin: 11.2 g/dL — ABNORMAL LOW (ref 13.0–17.0)
MCH: 30.4 pg (ref 26.0–34.0)
MCHC: 32.9 g/dL (ref 30.0–36.0)
MCV: 92.4 fL (ref 80.0–100.0)
Platelets: 220 10*3/uL (ref 150–400)
RBC: 3.68 MIL/uL — ABNORMAL LOW (ref 4.22–5.81)
RDW: 13.5 % (ref 11.5–15.5)
WBC: 5.8 10*3/uL (ref 4.0–10.5)
nRBC: 0 % (ref 0.0–0.2)

## 2023-11-27 LAB — MAGNESIUM: Magnesium: 1.9 mg/dL (ref 1.7–2.4)

## 2023-11-27 LAB — TSH: TSH: 2.424 u[IU]/mL (ref 0.350–4.500)

## 2023-11-27 LAB — BASIC METABOLIC PANEL
Anion gap: 9 (ref 5–15)
BUN: 20 mg/dL (ref 8–23)
CO2: 27 mmol/L (ref 22–32)
Calcium: 9 mg/dL (ref 8.9–10.3)
Chloride: 99 mmol/L (ref 98–111)
Creatinine, Ser: 1.28 mg/dL — ABNORMAL HIGH (ref 0.61–1.24)
GFR, Estimated: 57 mL/min — ABNORMAL LOW (ref >60)
Glucose, Bld: 123 mg/dL — ABNORMAL HIGH (ref 70–99)
Potassium: 3.8 mmol/L (ref 3.5–5.1)
Sodium: 135 mmol/L (ref 135–145)

## 2023-11-27 LAB — SURGICAL PCR SCREEN
MRSA, PCR: POSITIVE — AB
Staphylococcus aureus: POSITIVE — AB

## 2023-11-27 LAB — TYPE AND SCREEN
ABO/RH(D): A POS
Antibody Screen: NEGATIVE

## 2023-11-27 NOTE — Telephone Encounter (Addendum)
Covering [employer] today. Received call from [facility] with Pre-Admission Testing that patient was incidentally noted to have complete heart block HR 42bpm in pre-op eval, pending spinal surgery on 11/30/23. No prior EKG or cardiology eval on file. Per Fayrene Fearing patient is completely asymptomatic and BP 133/53. Not on any AVN blocking agents. Discussed with Dr. Elberta Fortis with EP - given asymptomatic nature do not need to send to ED but he will help with expediting clinic follow-up and the office will call the patient with appointment information. The patient was also informed not to drive if feeling unwell and to call 911 for any episodes of syncope or lightheadedness. James with preop will also help facilitate getting labs - BMET, CBC, TSH, Mg. Per Dr. Elberta Fortis patient should not proceed with surgery until further evaluated by cardiac team, Fayrene Fearing acknowledged this.  Forwarding this msg to Dr. Elberta Fortis per his request. He is going to coordinate with EP scheduling team to help get this patient an OV - they will call him with appt info.

## 2023-11-27 NOTE — Progress Notes (Incomplete)
Anesthesia Chart Review:  82 year old male with pertinent history including HTN, GERD, hypothyroid (on Synthroid), RA (on leflunomide), lumbar spinal stimulator in place, postoperative nausea and vomiting.  Preop EKG done today notable for A-V dissociation.  This appears to be a new finding for the patient.  Comparison tracing from 05/03/2022 shows sinus rhythm, rate 85, right bundle branch block left anterior fascicular block (tracing scanned under media tab in correspondence dated 07/25/2022).  Patient is completely asymptomatic, denies any chest pain, shortness of breath, presyncope, syncope.  He is not on any nodal blocking agents.  On exam he is in no acute distress.  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  Auscultation reveals a systolic murmur.    Notably, he did have a prior echo 04/12/2019 showing EF 60 to 65%, mild aortic calcification without stenosis.  I reached out to cardiology to help expedite evaluation.  I spoke with Ronie Spies, PA-C who advised that EP cardiologist Dr. Elberta Fortis will work to get patient seen expeditiously in the outpatient clinic.  Patient was advised that if he develops any concerning cardiovascular symptoms including lightheadedness or syncope he needs to be evaluated in the ED urgently.  He was also advised that he would not be able to proceed with planned lumbar fusion on 12/19 without first undergoing cardiology evaluation.  Patient verbalized understanding.  He also reported he is scheduled to see his PCP Dr. Burton Apley at 3:30 PM today.  He was given a copy of today's EKG tracing to give to Dr. Su Hilt for his review/records.  Magnesium and TSH were also added to preop labs at the request of Dr. Elberta Fortis.  Cardiology help appreciated. Will wait for their recommendations.   Zannie Cove [facility] Short Stay Center/Anesthesiology Phone [phone] 11/29/2023 9:13 AM

## 2023-11-27 NOTE — Progress Notes (Signed)
PCP - Dr. Burton Apley Cardiologist - Dr. Cristal Deer End  PPM/ICD - Denies Device Orders - N/A Rep Notified - N/A   Patient has a pain stimulator on his left lower back and was instructed to bring the remote.  Patient stated he believe's the battery is dead to pain stimulator but will bring his remote.   Chest x-ray - n/a EKG - 11-27-23 Stress Test - denies ECHO - 04/12/19 Cardiac Cath - denies   Sleep Study - denies CPAP - n/a  Fasting Blood Sugar - denies Checks Blood Sugar _____ times a day n/a  Last dose of GLP1 agonist-  denies GLP1 instructions: n/a  Blood Thinner Instructions: n/a, does not take Aspirin Instructions: n/a, does not take  ERAS Protcol - NPO PRE-SURGERY Ensure or G2- n/a  COVID TEST- N   Anesthesia review: Y, hx of difficult airway  Patient denies shortness of breath, fever, cough and chest pain at PAT appointment. Patient denies any respiratory issues at this time.    All instructions explained to the patient, with a verbal understanding of the material. Patient agrees to go over the instructions while at home for a better understanding. Patient also instructed to self quarantine after being tested for COVID-19. The opportunity to ask questions was provided.

## 2023-11-28 ENCOUNTER — Telehealth: Payer: Self-pay | Admitting: Cardiology

## 2023-11-28 NOTE — Progress Notes (Signed)
Tretha Sciara, [place] scheduler with Dr. Lovell Sheehan, left voicemail with the following surgical PCR results: +MRSA and +MSSA. Callback number given for any questions.

## 2023-11-28 NOTE — Telephone Encounter (Signed)
   Pre-operative Risk Assessment  Last visit: greater than 3 yr ago  NEXT VISIT: 11/29/2023   Patient Name: George Johnston  DOB: 07-30-1941 MRN: [id number]      Request for Surgical Clearance    Procedure:   Lumbar fusion  Date of Surgery:  Clearance TBD                                 Surgeon:  Dr. Tressie Stalker  Surgeon's Group or Practice Name:  [facility] Phone number:  [phone] Fax number:  [phone]   Type of Clearance Requested:   - Medical    Type of Anesthesia:  General    Additional requests/questions:    Signed, Royann Shivers   11/28/2023, 2:27 PM

## 2023-11-29 ENCOUNTER — Encounter: Payer: Self-pay | Admitting: *Deleted

## 2023-11-29 ENCOUNTER — Encounter (HOSPITAL_COMMUNITY): Payer: Self-pay | Admitting: Physician Assistant

## 2023-11-29 ENCOUNTER — Encounter: Payer: Self-pay | Admitting: Cardiology

## 2023-11-29 ENCOUNTER — Ambulatory Visit: Payer: Medicare Other | Attending: Cardiology | Admitting: Cardiology

## 2023-11-29 VITALS — BP 108/58 | HR 43 | Ht 70.0 in | Wt 140.2 lb

## 2023-11-29 DIAGNOSIS — I10 Essential (primary) hypertension: Secondary | ICD-10-CM

## 2023-11-29 DIAGNOSIS — I442 Atrioventricular block, complete: Secondary | ICD-10-CM

## 2023-11-29 NOTE — Progress Notes (Signed)
  Electrophysiology Office Note:   Date:  11/29/2023  ID:  George Johnston, DOB 10/28/1941, MRN [id number]  Primary Cardiologist: None Primary Heart Failure: None Electrophysiologist: Zameria Vogl Jorja Loa, MD      History of Present Illness:   George Johnston is a 82 y.o. male with h/o hypertension, hypothyroidism, rheumatoid arthritis seen today for  for Electrophysiology evaluation of heart block at the request of Delane Ginger.   Patient presented to preadmission testing for pending spinal surgery on 11/30/2023.  He was found to be in complete heart block.  Patient is on no AV nodal blocking agents.  The patient has had fatigue and weakness with exertion for the last few months.  He has no chest pain, and is not short of breath.  He states that his wife died a few months ago, and he has been feeling poorly ever since.  His spinal surgery has been canceled.    Review of systems complete and found to be negative unless listed in HPI.   EP Information / Studies Reviewed:    EKG is ordered today. Personal review as below.  EKG Interpretation Date/Time:  Wednesday November 29 2023 08:29:33 EST Ventricular Rate:  43 PR Interval:    QRS Duration:  146 QT Interval:  514 QTC Calculation: 434 R Axis:   -69  Text Interpretation: Sinus rhythm Right bundle branch block Left anterior fascicular block Bifascicular block Minimal voltage criteria for LVH, may be normal variant ( R in aVL ) Septal infarct , age undetermined When compared with ECG of 27-Nov-2023 13:47, No significant change since last tracing Confirmed by [facility], Djeneba Barsch ([id number]) on 11/29/2023 8:32:24 AM     Risk Assessment/Calculations:              Physical Exam:   VS:  BP (!) 108/58   Pulse (!) 43   Ht 5\' 10"  (1.778 m)   Wt 140 lb 3.2 oz (63.6 kg)   SpO2 97%   BMI 20.12 kg/m    Wt Readings from Last 3 Encounters:  11/29/23 140 lb 3.2 oz (63.6 kg)  11/27/23 139 lb (63 kg)  10/15/23 140 lb (63.5 kg)     GEN: Well nourished,  well developed in no acute distress NECK: No JVD; No carotid bruits CARDIAC: Cardiac, regular, no murmurs, rubs, gallops RESPIRATORY:  Clear to auscultation without rales, wheezing or rhonchi  ABDOMEN: Soft, non-tender, non-distended EXTREMITIES:  No edema; No deformity   ASSESSMENT AND PLAN:    1.  Complete heart block: Found during preoperative evaluation.  He feels quite poor, with weakness, fatigue, shortness of breath.  He is on no AV nodal blockers.  He would thus benefit from pacemaker implant.  Risk and benefits of been discussed.  Risk and clued bleeding, tamponade, infection, pneumothorax, lead dislodgment, MI, worsening renal failure, death, stroke.  He understands these risks and is agreed to the procedure.  2.  Hypertension: Currently well-controlled  Follow up with Dr. Elberta Fortis as usual post procedure  Signed, Euriah Matlack Jorja Loa, MD

## 2023-11-29 NOTE — Telephone Encounter (Signed)
    Primary Cardiologist:None  Chart reviewed as part of pre-operative protocol coverage. Because of George Johnston's past medical history and time since last visit, he/she will require a follow-up visit in order to better assess preoperative cardiovascular risk.  Pre-op covering staff: - Please schedule Office appointment and call patient to inform them. - Please contact requesting surgeon's office via preferred method (i.e, phone, fax) to inform them of need for appointment prior to surgery.  If applicable, this message will also be routed to pharmacy pool and/or primary cardiologist for input on holding anticoagulant/antiplatelet agent as requested below so that this information is available at time of patient's appointment.   [name], NP  11/29/2023, 8:18 AM

## 2023-11-29 NOTE — Telephone Encounter (Signed)
Patient see by Dr. Elberta Fortis today. Patient was set up to have a pacemanker implant tomorrow. Unsure if patient was cleared for upcoming lumbar fusion

## 2023-11-29 NOTE — H&P (View-Only) (Signed)
  Electrophysiology Office Note:   Date:  11/29/2023  ID:  George Johnston, DOB 10/28/1941, MRN [id number]  Primary Cardiologist: None Primary Heart Failure: None Electrophysiologist: George Vogl Jorja Loa, MD      History of Present Illness:   George Johnston is a 82 y.o. male with h/o hypertension, hypothyroidism, rheumatoid arthritis seen today for  for Electrophysiology evaluation of heart block at the request of George Johnston.   Patient presented to preadmission testing for pending spinal surgery on 11/30/2023.  He was found to be in complete heart block.  Patient is on no AV nodal blocking agents.  The patient has had fatigue and weakness with exertion for the last few months.  He has no chest pain, and is not short of breath.  He states that his wife died a few months ago, and he has been feeling poorly ever since.  His spinal surgery has been canceled.    Review of systems complete and found to be negative unless listed in HPI.   EP Information / Studies Reviewed:    EKG is ordered today. Personal review as below.  EKG Interpretation Date/Time:  Wednesday November 29 2023 08:29:33 EST Ventricular Rate:  43 PR Interval:    QRS Duration:  146 QT Interval:  514 QTC Calculation: 434 R Axis:   -69  Text Interpretation: Sinus rhythm Right bundle branch block Left anterior fascicular block Bifascicular block Minimal voltage criteria for LVH, may be normal variant ( R in aVL ) Septal infarct , age undetermined When compared with ECG of 27-Nov-2023 13:47, No significant change since last tracing Confirmed by George Johnston, George Johnston ([id number]) on 11/29/2023 8:32:24 AM     Risk Assessment/Calculations:              Physical Exam:   VS:  BP (!) 108/58   Pulse (!) 43   Ht 5\' 10"  (1.778 m)   Wt 140 lb 3.2 oz (63.6 kg)   SpO2 97%   BMI 20.12 kg/m    Wt Readings from Last 3 Encounters:  11/29/23 140 lb 3.2 oz (63.6 kg)  11/27/23 139 lb (63 kg)  10/15/23 140 lb (63.5 kg)     GEN: Well nourished,  well developed in no acute distress NECK: No JVD; No carotid bruits CARDIAC: Cardiac, regular, no murmurs, rubs, gallops RESPIRATORY:  Clear to auscultation without rales, wheezing or rhonchi  ABDOMEN: Soft, non-tender, non-distended EXTREMITIES:  No edema; No deformity   ASSESSMENT AND PLAN:    1.  Complete heart block: Found during preoperative evaluation.  He feels quite poor, with weakness, fatigue, shortness of breath.  He is on no AV nodal blockers.  He would thus benefit from pacemaker implant.  Risk and benefits of been discussed.  Risk and clued bleeding, tamponade, infection, pneumothorax, lead dislodgment, MI, worsening renal failure, death, stroke.  He understands these risks and is agreed to the procedure.  2.  Hypertension: Currently well-controlled  Follow up with George Johnston as usual post procedure  Signed, George Johnston Jorja Loa, MD

## 2023-11-29 NOTE — Pre-Procedure Instructions (Signed)
Instructed patient on the following items: Arrival time 1100 Nothing to eat or drink after midnight No meds AM of procedure Responsible person to drive you home and stay with you for 24 hrs Wash with special soap night before and morning of procedure  

## 2023-11-29 NOTE — Telephone Encounter (Signed)
Patient surgery has been canceled.  I will remove request from preoperative pool.  Thomasene Ripple. Zeta Bucy NP-C     11/29/2023, 9:31 AM [facility] [address] Office [phone] Fax [phone]

## 2023-11-29 NOTE — Patient Instructions (Signed)
Medication Instructions:  Your physician recommends that you continue on your current medications as directed. Please refer to the Current Medication list given to you today.     * If you need a refill on your cardiac medications before your next appointment, please call your pharmacy. *   Labwork: None ordered   Testing/Procedures: Your physician has recommended that you have a pacemaker inserted. A pacemaker is a small device that is placed under the skin of your chest or abdomen to help control abnormal heart rhythms. This device uses electrical pulses to prompt the heart to beat at a normal rate. Pacemakers are used to treat heart rhythms that are too slow. Wire (leads) are attached to the pacemaker that goes into the chambers of you heart. This is done in the hospital and usually requires and overnight stay. Please follow your instruction letter.  You will have an echocardiogram prior to this procedure tomorrow.  This will be done at the hospital before the procedure.   Follow-Up: You will be scheduled for a 2 week wound check and 3 month physician check.  Thank you for choosing [facility]!!   Dory Horn, RN [phone]   Other Instructions   Pacemaker Implantation, Adult Pacemaker implantation is a procedure to place a pacemaker inside your chest. A pacemaker is a small computer that sends electrical signals to the heart and helps your heart beat normally. A pacemaker also stores information about your heart rhythms. You may need pacemaker implantation if you: Have a slow heartbeat (bradycardia). Faint (syncope). Have shortness of breath (dyspnea) due to heart problems.  The pacemaker attaches to your heart through a wire, called a lead. Sometimes just one lead is needed. Other times, there will be two leads. There are two types of pacemakers: Transvenous pacemaker. This type is placed under the skin or muscle of your chest. The lead goes through a vein in the chest  area to reach the inside of the heart. Epicardial pacemaker. This type is placed under the skin or muscle of your chest or belly. The lead goes through your chest to the outside of the heart.  Tell a health care provider about: Any allergies you have. All medicines you are taking, including vitamins, herbs, eye drops, creams, and over-the-counter medicines. Any problems you or family members have had with anesthetic medicines. Any blood or bone disorders you have. Any surgeries you have had. Any medical conditions you have. Whether you are pregnant or may be pregnant. What are the risks? Generally, this is a safe procedure. However, problems may occur, including: Infection. Bleeding. Failure of the pacemaker or the lead. Collapse of a lung or bleeding into a lung. Blood clot inside a blood vessel with a lead. Damage to the heart. Infection inside the heart (endocarditis). Allergic reactions to medicines.  What happens before the procedure? Staying hydrated Follow instructions from your health care provider about hydration, which may include: Up to 2 hours before the procedure - you may continue to drink clear liquids, such as water, clear fruit juice, black coffee, and plain tea.  Eating and drinking restrictions Follow instructions from your health care provider about eating and drinking, which may include: 8 hours before the procedure - stop eating heavy meals or foods such as meat, fried foods, or fatty foods. 6 hours before the procedure - stop eating light meals or foods, such as toast or cereal. 6 hours before the procedure - stop drinking milk or drinks that contain milk. 2 hours  before the procedure - stop drinking clear liquids.  Medicines Ask your health care provider about: Changing or stopping your regular medicines. This is especially important if you are taking diabetes medicines or blood thinners. Taking medicines such as aspirin and ibuprofen. These medicines can  thin your blood. Do not take these medicines before your procedure if your health care provider instructs you not to. You may be given antibiotic medicine to help prevent infection. General instructions You will have a heart evaluation. This may include an electrocardiogram (ECG), chest X-ray, and heart imaging (echocardiogram,  or echo) tests. You will have blood tests. Do not use any products that contain nicotine or tobacco, such as cigarettes and e-cigarettes. If you need help quitting, ask your health care provider. Plan to have someone take you home from the hospital or clinic. If you will be going home right after the procedure, plan to have someone with you for 24 hours. Ask your health care provider how your surgical site will be marked or identified. What happens during the procedure? To reduce your risk of infection: Your health care team will wash or sanitize their hands. Your skin will be washed with soap. Hair may be removed from the surgical area. An IV tube will be inserted into one of your veins. You will be given one or more of the following: A medicine to help you relax (sedative). A medicine to numb the area (local anesthetic). A medicine to make you fall asleep (general anesthetic). If you are getting a transvenous pacemaker: An incision will be made in your upper chest. A pocket will be made for the pacemaker. It may be placed under the skin or between layers of muscle. The lead will be inserted into a blood vessel that returns to the heart. While X-rays are taken by an imaging machine (fluoroscopy), the lead will be advanced through the vein to the inside of your heart. The other end of the lead will be tunneled under the skin and attached to the pacemaker. If you are getting an epicardial pacemaker: An incision will be made near your ribs or breastbone (sternum) for the lead. The lead will be attached to the outside of your heart. Another incision will be made in  your chest or upper belly to create a pocket for the pacemaker. The free end of the lead will be tunneled under the skin and attached to the pacemaker. The transvenous or epicardial pacemaker will be tested. Imaging studies may be done to check the lead position. The incisions will be closed with stitches (sutures), adhesive strips, or skin glue. Bandages (dressing) will be placed over the incisions. The procedure may vary among health care providers and hospitals. What happens after the procedure? Your blood pressure, heart rate, breathing rate, and blood oxygen level will be monitored until the medicines you were given have worn off. You will be given antibiotics and pain medicine. ECG and chest x-rays will be done. You will wear a continuous type of ECG (Holter monitor) to check your heart rhythm. Your health care provider will program the pacemaker. Do not drive for 24 hours if you received a sedative. This information is not intended to replace advice given to you by your health care provider. Make sure you discuss any questions you have with your health care provider. Document Released: 11/18/2002 Document Revised: 06/17/2016 Document Reviewed: 05/11/2016 Elsevier Interactive Patient Education  2018 [employer].     Pacemaker Implantation, Adult, Care After This sheet gives you information  about how to care for yourself after your procedure. Your health care provider may also give you more specific instructions. If you have problems or questions, contact your health care provider. What can I expect after the procedure? After the procedure, it is common to have: Mild pain. Slight bruising. Some swelling over the incision. A slight bump over the skin where the device was placed. Sometimes, it is possible to feel the device under the skin. This is normal.  Follow these instructions at home: Medicines Take over-the-counter and prescription medicines only as told by your health care  provider. If you were prescribed an antibiotic medicine, take it as told by your health care provider. Do not stop taking the antibiotic even if you start to feel better. Wound care Do not remove the bandage on your chest until directed to do so by your health care provider. After your bandage is removed, you may see pieces of tape called skin adhesive strips over the area where the cut was made (incision site). Let them fall off on their own. Check the incision site every day to make sure it is not infected, bleeding, or starting to pull apart. Do not use lotions or ointments near the incision site unless directed to do so. Keep the incision area clean and dry for 2-3 days after the procedure or as directed by your health care provider. It takes several weeks for the incision site to completely heal. Do not take baths, swim, or use a hot tub for 7-10 days or as otherwise directed by your health care provider. Activity Do not drive or use heavy machinery while taking prescription pain medicine. Do not drive for 24 hours if you were given a medicine to help you relax (sedative). Check with your health care provider before you start to drive or play sports. Avoid sudden jerking, pulling, or chopping movements that pull your upper arm far away from your body. Avoid these movements for at least 6 weeks or as long as told by your health care provider. Do not lift your upper arm above your shoulders for at least 6 weeks or as long as told by your health care provider. This means no tennis, golf, or swimming. You may go back to work when your health care provider says it is okay. Pacemaker care You may be shown how to transfer data from your pacemaker through the phone to your health care provider. Always let all health care providers know about your pacemaker before you have any medical procedures or tests. Wear a medical ID bracelet or necklace stating that you have a pacemaker. Carry a pacemaker ID  card with you at all times. Your pacemaker battery will last for 5-15 years. Routine checks by your health care provider will let the health care provider know when the battery is starting to run down. The pacemaker will need to be replaced when the battery starts to run down. Do not use amateur Proofreader. Other electrical devices are safe to use, including power tools, lawn mowers, and speakers. If you are unsure of whether something is safe to use, ask your health care provider. When using your cell phone, hold it to the ear opposite the pacemaker. Do not leave your cell phone in a pocket over the pacemaker. Avoid places or objects that have a strong electric or magnetic field, including: Scientist, physiological. When at the airport, let officials know that you have a pacemaker. Power plants. Large electrical generators.  Radiofrequency transmission towers, such as cell phone and radio towers. General instructions Weigh yourself every day. If you suddenly gain weight, fluid may be building up in your body. Keep all follow-up visits as told by your health care provider. This is important. Contact a health care provider if: You gain weight suddenly. Your legs or feet swell. It feels like your heart is fluttering or skipping beats (heart palpitations). You have chills or a fever. You have more redness, swelling, or pain around your incisions. You have more fluid or blood coming from your incisions. Your incisions feel warm to the touch. You have pus or a bad smell coming from your incisions. Get help right away if: You have chest pain. You have trouble breathing or are short of breath. You become extremely tired. You are light-headed or you faint. This information is not intended to replace advice given to you by your health care provider. Make sure you discuss any questions you have with your health care provider. Document Released: 06/17/2005 Document  Revised: 09/09/2016 Document Reviewed: 09/09/2016 Elsevier Interactive Patient Education  2018 [employer].    Supplemental Discharge Instructions for  Pacemaker/Defibrillator Patients  ACTIVITY No heavy lifting or vigorous activity with your left/right arm for 6 to 8 weeks.  Do not raise your left/right arm above your head for one week.  Gradually raise your affected arm as drawn below.           __  NO DRIVING for     ; you may begin driving on     .  WOUND CARE Keep the wound area clean and dry.  Do not get this area wet for one week. No showers for one week; you may shower on     . The tape/steri-strips on your wound will fall off; do not pull them off.  No bandage is needed on the site.  DO  NOT apply any creams, oils, or ointments to the wound area. If you notice any drainage or discharge from the wound, any swelling or bruising at the site, or you develop a fever > 101? F after you are discharged home, call the office at once.  SPECIAL INSTRUCTIONS You are still able to use cellular telephones; use the ear opposite the side where you have your pacemaker/defibrillator.  Avoid carrying your cellular phone near your device. When traveling through airports, show security personnel your identification card to avoid being screened in the metal detectors.  Ask the security personnel to use the hand wand. Avoid arc welding equipment, MRI testing (magnetic resonance imaging), TENS units (transcutaneous nerve stimulators).  Call the office for questions about other devices. Avoid electrical appliances that are in poor condition or are not properly grounded. Microwave ovens are safe to be near or to operate.  ADDITIONAL INFORMATION FOR DEFIBRILLATOR PATIENTS SHOULD YOUR DEVICE GO OFF: If your device goes off ONCE and you feel fine afterward, notify the device clinic nurses. If your device goes off ONCE and you do not feel well afterward, call 911. If your device goes off TWICE, call  911. If your device goes off THREE TIMES IN ONE DAY, call 911.  DO NOT DRIVE YOURSELF OR A FAMILY MEMBER WITH A DEFIBRILLATOR TO THE HOSPITAL--CALL 911.

## 2023-11-30 ENCOUNTER — Other Ambulatory Visit: Payer: Self-pay

## 2023-11-30 ENCOUNTER — Encounter (HOSPITAL_COMMUNITY): Payer: Self-pay | Admitting: Physician Assistant

## 2023-11-30 ENCOUNTER — Ambulatory Visit (HOSPITAL_COMMUNITY)
Admission: RE | Admit: 2023-11-30 | Discharge: 2023-11-30 | Disposition: A | Discharge status: Home / Self Care | Payer: Medicare Other | Attending: Cardiology | Admitting: Cardiology

## 2023-11-30 ENCOUNTER — Ambulatory Visit (HOSPITAL_COMMUNITY): Payer: Medicare Other

## 2023-11-30 ENCOUNTER — Encounter (HOSPITAL_COMMUNITY)
Admission: RE | Disposition: A | Discharge status: Home / Self Care | Payer: Self-pay | Source: Home / Self Care | Attending: Cardiology

## 2023-11-30 ENCOUNTER — Encounter (HOSPITAL_COMMUNITY): Admission: RE | Payer: Self-pay | Source: Home / Self Care

## 2023-11-30 DIAGNOSIS — I442 Atrioventricular block, complete: Secondary | ICD-10-CM | POA: Insufficient documentation

## 2023-11-30 DIAGNOSIS — I119 Hypertensive heart disease without heart failure: Secondary | ICD-10-CM | POA: Insufficient documentation

## 2023-11-30 HISTORY — PX: PACEMAKER IMPLANT: EP1218

## 2023-11-30 LAB — ECHOCARDIOGRAM COMPLETE
AR max vel: 1.29 cm2
AV Area VTI: 1.55 cm2
AV Area mean vel: 1.47 cm2
AV Mean grad: 12.5 mm[Hg]
AV Peak grad: 24.3 mm[Hg]
Ao pk vel: 2.46 m/s
Area-P 1/2: 2.16 cm2
Calc EF: 71 %
Height: 70 in
S' Lateral: 2.2 cm
Single Plane A2C EF: 66.5 %
Single Plane A4C EF: 74.5 %
Weight: 2160 [oz_av]

## 2023-11-30 SURGERY — POSTERIOR LUMBAR FUSION 1 LEVEL
Anesthesia: General

## 2023-11-30 SURGERY — PACEMAKER IMPLANT

## 2023-11-30 MED ORDER — MIDAZOLAM HCL 2 MG/2ML IJ SOLN
INTRAMUSCULAR | Status: AC
  Filled 2023-11-30: qty 2

## 2023-11-30 MED ORDER — HEPARIN (PORCINE) IN NACL 1000-0.9 UT/500ML-% IV SOLN
INTRAVENOUS | Status: DC | PRN
  Administered 2023-11-30: 500 mL

## 2023-11-30 MED ORDER — SODIUM CHLORIDE 0.9 % IV SOLN
INTRAVENOUS | Status: AC
  Administered 2023-11-30: 80 mg
  Filled 2023-11-30: qty 2

## 2023-11-30 MED ORDER — LIDOCAINE HCL (PF) 1 % IJ SOLN
INTRAMUSCULAR | Status: AC
  Filled 2023-11-30: qty 30

## 2023-11-30 MED ORDER — ONDANSETRON HCL 4 MG/2ML IJ SOLN
4.0000 mg | Freq: Four times a day (QID) | INTRAMUSCULAR | Status: DC | PRN
Start: 2023-11-30 — End: 2023-12-01

## 2023-11-30 MED ORDER — ACETAMINOPHEN 325 MG PO TABS
325.0000 mg | ORAL_TABLET | ORAL | Status: DC | PRN
  Administered 2023-11-30: 650 mg via ORAL
  Filled 2023-11-30: qty 2

## 2023-11-30 MED ORDER — GENTAMICIN SULFATE 40 MG/ML IJ SOLN: 80.0000 mg | INTRAMUSCULAR | Status: AC

## 2023-11-30 MED ORDER — CEFAZOLIN SODIUM-DEXTROSE 1-4 GM/50ML-% IV SOLN: 1.0000 g | Freq: Four times a day (QID) | INTRAVENOUS | Status: DC

## 2023-11-30 MED ORDER — CEFAZOLIN SODIUM-DEXTROSE 2-4 GM/100ML-% IV SOLN: 2.0000 g | INTRAVENOUS | Status: AC

## 2023-11-30 MED ORDER — CHLORHEXIDINE GLUCONATE 4 % EX SOLN: 4.0000 | Freq: Once | CUTANEOUS | Status: DC

## 2023-11-30 MED ORDER — PERFLUTREN LIPID MICROSPHERE
1.0000 mL | INTRAVENOUS | Status: AC | PRN
Start: 2023-11-30 — End: 2023-11-30
  Administered 2023-11-30: 2 mL via INTRAVENOUS

## 2023-11-30 MED ORDER — OXYCODONE HCL 5 MG PO TABS
20.0000 mg | ORAL_TABLET | Freq: Once | ORAL | Status: AC
Start: 2023-11-30 — End: 2023-11-30
  Administered 2023-11-30: 20 mg via ORAL
  Filled 2023-11-30: qty 4

## 2023-11-30 MED ORDER — FENTANYL CITRATE (PF) 100 MCG/2ML IJ SOLN
25.0000 ug | Freq: Once | INTRAMUSCULAR | Status: AC
  Administered 2023-11-30: 25 ug via INTRAVENOUS
  Filled 2023-11-30: qty 2

## 2023-11-30 MED ORDER — FENTANYL CITRATE (PF) 100 MCG/2ML IJ SOLN
INTRAMUSCULAR | Status: AC
  Filled 2023-11-30: qty 2

## 2023-11-30 MED ORDER — LIDOCAINE HCL (PF) 1 % IJ SOLN
INTRAMUSCULAR | Status: AC
  Filled 2023-11-30: qty 60

## 2023-11-30 MED ORDER — SODIUM CHLORIDE 0.9 % IV SOLN
INTRAVENOUS | Status: DC
Start: 2023-11-30 — End: 2023-11-30

## 2023-11-30 MED ORDER — POVIDONE-IODINE 10 % EX SWAB
2.0000 | Freq: Once | CUTANEOUS | Status: AC
  Administered 2023-11-30: 2 via TOPICAL

## 2023-11-30 MED ORDER — LIDOCAINE HCL (PF) 1 % IJ SOLN
INTRAMUSCULAR | Status: DC | PRN
  Administered 2023-11-30: 30 mL
  Administered 2023-11-30: 60 mL

## 2023-11-30 MED ORDER — HYDRALAZINE HCL 20 MG/ML IJ SOLN
10.0000 mg | Freq: Once | INTRAMUSCULAR | Status: AC
  Administered 2023-11-30: 10 mg via INTRAVENOUS
  Filled 2023-11-30: qty 1

## 2023-11-30 MED ORDER — CEFAZOLIN SODIUM-DEXTROSE 2-4 GM/100ML-% IV SOLN
INTRAVENOUS | Status: AC
  Administered 2023-11-30: 2 g via INTRAVENOUS
  Filled 2023-11-30: qty 100

## 2023-11-30 SURGICAL SUPPLY — 13 items
CABLE SURGICAL S-101-97-12 (CABLE) ×1 IMPLANT
CATH CPS LOCATOR 3D MED (CATHETERS) IMPLANT
HELIX LOCKING TOOL (MISCELLANEOUS) ×1 IMPLANT
LEAD ULTIPACE 52 LPA1231/52 (Lead) IMPLANT
LEAD ULTIPACE 65 LPA1231/65 (Lead) IMPLANT
PACEMAKER ASSURITY DR-RF (Pacemaker) IMPLANT
PAD DEFIB RADIO PHYSIO CONN (PAD) ×1 IMPLANT
SHEATH 7FR PRELUDE SNAP 13 (SHEATH) IMPLANT
SHEATH 9FR PRELUDE SNAP 13 (SHEATH) IMPLANT
SLITTER AGILIS HISPRO (INSTRUMENTS) IMPLANT
TOOL HELIX LOCKING (MISCELLANEOUS) IMPLANT
TRAY PACEMAKER INSERTION (PACKS) ×1 IMPLANT
WIRE HI TORQ VERSACORE-J 145CM (WIRE) IMPLANT

## 2023-11-30 NOTE — Interval H&P Note (Signed)
History and Physical Interval Note:  11/30/2023 12:52 PM  George Johnston  has presented today for surgery, with the diagnosis of heart block.  The various methods of treatment have been discussed with the patient and family. After consideration of risks, benefits and other options for treatment, the patient has consented to  Procedure(s): PACEMAKER IMPLANT (N/A) as a surgical intervention.  The patient's history has been reviewed, patient examined, no change in status, stable for surgery.  I have reviewed the patient's chart and labs.  Questions were answered to the patient's satisfaction.     Tanner Vigna [employer]

## 2023-11-30 NOTE — Discharge Instructions (Signed)
After Your Pacemaker   You have a Abbott Pacemaker  ACTIVITY Do not lift your arm above shoulder height for 1 week after your procedure. After 7 days, you may progress as below.  You should remove your sling 24 hours after your procedure, unless otherwise instructed by your provider.     Thursday December 07, 2023  Friday December 08, 2023 Saturday December 09, 2023 Sunday December 10, 2023   Do not lift, push, pull, or carry anything over 10 pounds with the affected arm until 6 weeks (Thursday January 11, 2024 ) after your procedure.   You may drive AFTER your wound check, unless you have been told otherwise by your provider.   Ask your healthcare provider when you can go back to work   INCISION/Dressing If you are on a blood thinner such as Coumadin, Xarelto, Eliquis, Plavix, or Pradaxa please confirm with your provider when this should be resumed.   If large square, outer bandage is left in place, this can be removed after 24 hours from your procedure. Do not remove steri-strips or glue as below.   If a PRESSURE DRESSING (a bulky dressing that usually goes up over your shoulder) was applied or left in place, please follow instructions given by your provider on when to return to have this removed.   Monitor your Pacemaker site for redness, swelling, and drainage. Call the device clinic at [phone] if you experience these symptoms or fever/chills.  If your incision is sealed with Steri-strips or staples, you may shower 7 days after your procedure or when told by your provider. Do not remove the steri-strips or let the shower hit directly on your site. You may wash around your site with soap and water.    If you were discharged in a sling, please do not wear this during the day more than 48 hours after your surgery unless otherwise instructed. This may increase the risk of stiffness and soreness in your shoulder.   Avoid lotions, ointments, or perfumes over your incision until it  is well-healed.  You may use a hot tub or a pool AFTER your wound check appointment if the incision is completely closed.  Pacemaker Alerts:  Some alerts are vibratory and others beep. These are NOT emergencies. Please call our office to let us know. If this occurs at night or on weekends, it can wait until the next business day. Send a remote transmission.  If your device is capable of reading fluid status (for heart failure), you will be offered monthly monitoring to review this with you.   DEVICE MANAGEMENT Remote monitoring is used to monitor your pacemaker from home. This monitoring is scheduled every 91 days by our office. It allows [place] to keep an eye on the functioning of your device to ensure it is working properly. You will routinely see your Electrophysiologist annually (more often if necessary).   You should receive your ID card for your new device in 4-8 weeks. Keep this card with you at all times once received. Consider wearing a medical alert bracelet or necklace.  Your Pacemaker may be MRI compatible. This will be discussed at your next office visit/wound check.  You should avoid contact with strong electric or magnetic fields.   Do not use amateur (ham) radio equipment or electric (arc) welding torches. MP3 player headphones with magnets should not be used. Some devices are safe to use if held at least 12 inches (30 cm) from your Pacemaker. These include power tools, lawn  mowers, and speakers. If you are unsure if something is safe to use, ask your health care provider.  When using your cell phone, hold it to the ear that is on the opposite side from the Pacemaker. Do not leave your cell phone in a pocket over the Pacemaker.  You may safely use electric blankets, heating pads, computers, and microwave ovens.  Call the office right away if: You have chest pain. You feel more short of breath than you have felt before. You feel more light-headed than you have felt before. Your  incision starts to open up.  This information is not intended to replace advice given to you by your health care provider. Make sure you discuss any questions you have with your health care provider.

## 2023-12-01 ENCOUNTER — Telehealth: Payer: Self-pay | Admitting: Cardiology

## 2023-12-01 ENCOUNTER — Encounter (HOSPITAL_COMMUNITY): Payer: Self-pay | Admitting: Cardiology

## 2023-12-01 NOTE — Telephone Encounter (Signed)
New Message:       Patient says he had pacemaker put in yesterday. Patient says he feels nauseated.

## 2023-12-01 NOTE — Telephone Encounter (Signed)
Patient reports of ongoing nausea prior to device placement. States he has nausea today and wants to know if he can take zorfan. Advised patient to call PCP who can advise further. Patient has no complaints in regard to his device.

## 2023-12-04 ENCOUNTER — Telehealth: Payer: Self-pay

## 2023-12-04 NOTE — Telephone Encounter (Signed)
Follow-up after same day discharge: Implant date: 11/30/2023 MD: Loman Brooklyn, MD Device: St Jude Assurity Pacemaker Location: Left Chest   Wound check visit: 12/21/2023 90 day MD follow-up: 03/01/2024  Remote Transmission received:n/a  Dressing/sling removed: yes  Confirm OAC restart on: Pt has started medicines.  Please continue to monitor your cardiac device site for redness, swelling, and drainage. Call the device clinic at [phone] if you experience these symptoms, fever/chills, or have questions about your device.   Remote monitoring is used to monitor your cardiac device from home. This monitoring is scheduled every 91 days by our office. It allows [place] to keep an eye on the functioning of your device to ensure it is working properly.

## 2023-12-08 NOTE — Telephone Encounter (Signed)
Transmission received 12/08/2023.

## 2023-12-21 ENCOUNTER — Ambulatory Visit: Payer: Medicare Other | Attending: Cardiology

## 2023-12-21 DIAGNOSIS — R001 Bradycardia, unspecified: Secondary | ICD-10-CM

## 2023-12-21 LAB — CUP PACEART INCLINIC DEVICE CHECK
Battery Remaining Longevity: 69 mo
Battery Voltage: 3.01 V
Brady Statistic RA Percent Paced: 2.2 %
Brady Statistic RV Percent Paced: 99.92 %
Date Time Interrogation Session: 20250109124007
Implantable Lead Connection Status: 753985
Implantable Lead Connection Status: 753985
Implantable Lead Implant Date: 20241219
Implantable Lead Implant Date: 20241219
Implantable Lead Location: 753859
Implantable Lead Location: 753860
Implantable Pulse Generator Implant Date: 20241219
Lead Channel Impedance Value: 462.5 Ohm
Lead Channel Impedance Value: 475 Ohm
Lead Channel Pacing Threshold Amplitude: 0.75 V
Lead Channel Pacing Threshold Amplitude: 0.75 V
Lead Channel Pacing Threshold Amplitude: 0.875 V
Lead Channel Pacing Threshold Pulse Width: 0.5 ms
Lead Channel Pacing Threshold Pulse Width: 0.5 ms
Lead Channel Pacing Threshold Pulse Width: 0.5 ms
Lead Channel Sensing Intrinsic Amplitude: 3.2 mV
Lead Channel Setting Pacing Amplitude: 3.5 V
Lead Channel Setting Pacing Amplitude: 3.5 V
Lead Channel Setting Pacing Pulse Width: 0.5 ms
Lead Channel Setting Sensing Sensitivity: 4 mV
Pulse Gen Model: 2272
Pulse Gen Serial Number: 8223380

## 2023-12-21 NOTE — Patient Instructions (Signed)

## 2023-12-21 NOTE — Progress Notes (Signed)
 Normal Pacemaker wound check. Wound well healed. Thresholds, sensing, and impedances consistent with implant measurements and at 3.5V safety margin/auto capture until 3 month visit. No episodes. Reviewed arm restrictions to continue for 6 weeks total post op.  Pt enrolled in remote follow-up.

## 2024-01-04 ENCOUNTER — Telehealth: Payer: Self-pay | Admitting: *Deleted

## 2024-01-04 NOTE — Telephone Encounter (Signed)
Pt has been scheduled to see Leafy Ro proep clearance. Pt is grateful for the call and the help. I will update all parties involved.

## 2024-01-04 NOTE — Telephone Encounter (Signed)
   Name: George Johnston  DOB: [birth year]  MRN: [id number]  Primary Cardiologist: None  Chart reviewed as part of pre-operative protocol coverage. Because of Cortlandt Kosky Prouse's past medical history and time since last visit, he will require a follow-up in-office visit in order to better assess preoperative cardiovascular risk.  Pre-op covering staff: - Please schedule appointment and call patient to inform them. If patient already had an upcoming appointment within acceptable timeframe, please add "pre-op clearance" to the appointment notes so provider is aware. - Please contact requesting surgeon's office via preferred method (i.e, phone, fax) to inform them of need for appointment prior to surgery. t.   Napoleon Form, Leodis Rains, NP  01/04/2024, 4:48 PM

## 2024-01-04 NOTE — Telephone Encounter (Signed)
   Pre-operative Risk Assessment    Patient Name: George Johnston  DOB: [birth year] MRN: [id number]   Date of last office visit: 11/29/23 DR. CAMNITZ Date of next office visit: NONE   Request for Surgical Clearance    Procedure:   LUMBAR FUSION  Date of Surgery:  Clearance TBD                                Surgeon:  DR. Tressie Stalker Surgeon's Group or Practice Name:  [facility] Phone number:  [phone] EXT [id number] Fax number:  [phone]   Type of Clearance Requested:   - Medical ; NO BLOOD THINNERS NOTED TO BE HELD   Type of Anesthesia:  General    Additional requests/questions:    Elpidio Anis   01/04/2024, 4:27 PM

## 2024-01-05 NOTE — Telephone Encounter (Signed)
  George Johnston called back, she said George Johnston left her a message and the answer to her question is they needed a new clearance due to recent pacer inserted.

## 2024-01-10 NOTE — Progress Notes (Unsigned)
  Electrophysiology Office Note:   ID:  George Johnston, DOB [birth year], MRN [id number]  Primary Cardiologist: None Electrophysiologist: Will Jorja Loa, MD  {Click to update primary MD,subspecialty MD or APP then REFRESH:1}    History of Present Illness:   George Johnston is a 83 y.o. male with h/o CHB s/p PPM seen today for routine electrophysiology followup and pre-op clearance  Since last being seen in our clinic the patient reports doing ***.  he denies chest pain, palpitations, dyspnea, PND, orthopnea, nausea, vomiting, dizziness, syncope, edema, weight gain, or early satiety.   Review of systems complete and found to be negative unless listed in HPI.   EP Information / Studies Reviewed:    EKG is ordered today. Personal review as below.       PPM Interrogation-  reviewed in detail today,  See PACEART report.  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   Wt Readings from Last 3 Encounters:  11/30/23 135 lb (61.2 kg)  11/29/23 140 lb 3.2 oz (63.6 kg)  11/27/23 139 lb (63 kg)     GEN: No acute distress  NECK: No JVD; No carotid bruits CARDIAC: {EPRHYTHM:28826}, no murmurs, rubs, gallops RESPIRATORY:  Clear to auscultation without rales, wheezing or rhonchi  ABDOMEN: Soft, non-tender, non-distended EXTREMITIES:  {EDEMA LEVEL:28147::"No"} edema; No deformity   ASSESSMENT AND PLAN:    CHB s/p Abbott PPM  Normal PPM function See Pace Art report No changes today  HTN Stable on current regimen   Cardiac Clearance for Lumbar Fusion Pt may proceed without further cardiac work up.  We prefer pts to wait 6 weeks if possible after cardiac device, which is up after today. OK to schedule.    {Click here to Review PMH, Prob List, Meds, Allergies, SHx, FHx  :1}   Disposition:   Follow up with EP APP as scheduled for 91 day check.  Signed, Graciella Freer, PA-C

## 2024-01-11 ENCOUNTER — Ambulatory Visit: Payer: Medicare Other | Attending: Student | Admitting: Student

## 2024-01-11 ENCOUNTER — Encounter: Payer: Self-pay | Admitting: Student

## 2024-01-11 VITALS — BP 138/72 | HR 67 | Ht 69.0 in | Wt 141.8 lb

## 2024-01-11 DIAGNOSIS — I10 Essential (primary) hypertension: Secondary | ICD-10-CM | POA: Insufficient documentation

## 2024-01-11 DIAGNOSIS — Z01810 Encounter for preprocedural cardiovascular examination: Secondary | ICD-10-CM | POA: Diagnosis present

## 2024-01-11 DIAGNOSIS — I442 Atrioventricular block, complete: Secondary | ICD-10-CM | POA: Diagnosis present

## 2024-01-11 NOTE — Progress Notes (Signed)
Note faxed to requesting surgeons office.

## 2024-01-11 NOTE — Patient Instructions (Signed)
Medication Instructions:  Your physician recommends that you continue on your current medications as directed. Please refer to the Current Medication list given to you today.  *If you need a refill on your cardiac medications before your next appointment, please call your pharmacy*   Lab Work: None If you have labs (blood work) drawn today and your tests are completely normal, you will receive your results only by: MyChart Message (if you have MyChart) OR A paper copy in the mail If you have any lab test that is abnormal or we need to change your treatment, we will call you to review the results.   Follow-Up: At [facility], you and your health needs are our priority.  As part of our continuing mission to provide you with exceptional heart care, we have created designated Provider Care Teams.  These Care Teams include your primary Cardiologist (physician) and Advanced Practice Providers (APPs -  Physician Assistants and Nurse Practitioners) who all work together to provide you with the care you need, when you need it.  We recommend signing up for the patient portal called "MyChart".  Sign up information is provided on this After Visit Summary.  MyChart is used to connect with patients for Virtual Visits (Telemedicine).  Patients are able to view lab/test results, encounter notes, upcoming appointments, etc.  Non-urgent messages can be sent to your provider as well.   To learn more about what you can do with MyChart, go to [email or web address].    Your next appointment:   As scheduled

## 2024-01-17 ENCOUNTER — Other Ambulatory Visit: Payer: Self-pay | Admitting: Neurosurgery

## 2024-02-12 ENCOUNTER — Other Ambulatory Visit: Payer: Self-pay | Admitting: Neurosurgery

## 2024-02-14 ENCOUNTER — Encounter: Payer: Self-pay | Admitting: Cardiology

## 2024-02-14 NOTE — Progress Notes (Addendum)
 PERIOPERATIVE PRESCRIPTION FOR IMPLANTED CARDIAC DEVICE PROGRAMMING  Patient Information: Name:  AIDYNN KRENN  DOB:  [birth year]  MRN:  [id number]    Planned Procedure:  PLIF  Surgeon:  Dr. Lovell Sheehan  Date of Procedure:  02-22-24  Cautery will be used.  Position during surgery:  Prone   Please send documentation back to:  [facility] (Fax # [phone])   Device Information:  Clinic EP Physician:  Loman Brooklyn, MD   Device Type:  Pacemaker Manufacturer and Phone #:  St. Jude/Abbott: [phone] Pacemaker Dependent?:  Yes.   Date of Last Device Check:  12/21/2023 Normal Device Function?:  Yes.    Electrophysiologist's Recommendations:  Have magnet available. Provide continuous ECG monitoring when magnet is used or reprogramming is to be performed.  Device should be reprogrammed by industry representative to asynchronous pacing.  Per Device Clinic Standing Orders, Lenor Coffin, RN  4:11 PM 02/14/2024

## 2024-02-14 NOTE — Progress Notes (Signed)
 Surgical Instructions   Your procedure is scheduled on February 22, 2024. Report to [facility] Main Entrance "A" at 5:30 A.M., then check in with the Admitting office. Any questions or running late day of surgery: call [phone]  Questions prior to your surgery date: call [phone], Monday-Friday, 8am-4pm. If you experience any cold or flu symptoms such as cough, fever, chills, shortness of breath, etc. between now and your scheduled surgery, please notify us at the above number.     Remember:  Do not eat  or drink after midnight the night before your surgery      Take these medicines the morning of surgery with A SIP OF WATER  levothyroxine (SYNTHROID)   gabapentin (NEURONTIN) sertraline (ZOLOFT)  predniSONE (DELTASONE)  tamsulosin [facility])   May take these medicines IF NEEDED: acetaminophen (TYLENOL)  carboxymethylcellulose (REFRESH PLUS)  methocarbamol (ROBAXIN)  methocarbamol (ROBAXIN)  ondansetron (ZOFRAN)  Oxycodone HCl  promethazine (PHENERGAN)    One week prior to surgery, STOP taking any Aspirin (unless otherwise instructed by your surgeon) Aleve, Naproxen, Ibuprofen, Motrin, Advil, Goody's, BC's, all herbal medications, fish oil, and non-prescription vitamins.                     Do NOT Smoke (Tobacco/Vaping) for 24 hours prior to your procedure.  If you use a CPAP at night, you may bring your mask/headgear for your overnight stay.   You will be asked to remove any contacts, glasses, piercing's, hearing aid's, dentures/partials prior to surgery. Please bring cases for these items if needed.    Patients discharged the day of surgery will not be allowed to drive home, and someone needs to stay with them for 24 hours.  SURGICAL WAITING ROOM VISITATION Patients may have no more than 2 support people in the waiting area - these visitors may rotate.   Pre-op nurse will coordinate an appropriate time for 1 ADULT support person, who may not rotate, to accompany  patient in pre-op.  Children under the age of 65 must have an adult with them who is not the patient and must remain in the main waiting area with an adult.  If the patient needs to stay at the hospital during part of their recovery, the visitor guidelines for inpatient rooms apply.  Please refer to the [facility] website for the visitor guidelines for any additional information.   If you received a COVID test during your pre-op visit  it is requested that you wear a mask when out in public, stay away from anyone that may not be feeling well and notify your surgeon if you develop symptoms. If you have been in contact with anyone that has tested positive in the last 10 days please notify you surgeon.      Pre-operative 5 CHG Bathing Instructions   You can play a key role in reducing the risk of infection after surgery. Your skin needs to be as free of germs as possible. You can reduce the number of germs on your skin by washing with CHG (chlorhexidine gluconate) soap before surgery. CHG is an antiseptic soap that kills germs and continues to kill germs even after washing.   DO NOT use if you have an allergy to chlorhexidine/CHG or antibacterial soaps. If your skin becomes reddened or irritated, stop using the CHG and notify one of our RNs at [phone].   Please shower with the CHG soap starting 4 days before surgery using the following schedule:     Please keep  in mind the following:  DO NOT shave, including legs and underarms, starting the day of your first shower.   You may shave your face at any point before/day of surgery.  Place clean sheets on your bed the day you start using CHG soap. Use a clean washcloth (not used since being washed) for each shower. DO NOT sleep with pets once you start using the CHG.   CHG Shower Instructions:  Wash your face and private area with normal soap. If you choose to wash your hair, wash first with your normal shampoo.  After you use  shampoo/soap, rinse your hair and body thoroughly to remove shampoo/soap residue.  Turn the water OFF and apply about 3 tablespoons (45 ml) of CHG soap to a CLEAN washcloth.  Apply CHG soap ONLY FROM YOUR NECK DOWN TO YOUR TOES (washing for 3-5 minutes)  DO NOT use CHG soap on face, private areas, open wounds, or sores.  Pay special attention to the area where your surgery is being performed.  If you are having back surgery, having someone wash your back for you may be helpful. Wait 2 minutes after CHG soap is applied, then you may rinse off the CHG soap.  Pat dry with a clean towel  Put on clean clothes/pajamas   If you choose to wear lotion, please use ONLY the CHG-compatible lotions that are listed below.  Additional instructions for the day of surgery: DO NOT APPLY any lotions, deodorants, cologne, or perfumes.   Do not bring valuables to the hospital. [facility] is not responsible for any belongings/valuables. Do not wear nail polish, gel polish, artificial nails, or any other type of covering on natural nails (fingers and toes) Do not wear jewelry or makeup Put on clean/comfortable clothes.  Please brush your teeth.  Ask your nurse before applying any prescription medications to the skin.     CHG Compatible Lotions   Aveeno Moisturizing lotion  Cetaphil Moisturizing Cream  Cetaphil Moisturizing Lotion  Clairol Herbal Essence Moisturizing Lotion, Dry Skin  Clairol Herbal Essence Moisturizing Lotion, Extra Dry Skin  Clairol Herbal Essence Moisturizing Lotion, Normal Skin  Curel Age Defying Therapeutic Moisturizing Lotion with Alpha Hydroxy  Curel Extreme Care Body Lotion  Curel Soothing Hands Moisturizing Hand Lotion  Curel Therapeutic Moisturizing Cream, Fragrance-Free  Curel Therapeutic Moisturizing Lotion, Fragrance-Free  Curel Therapeutic Moisturizing Lotion, Original Formula  Eucerin Daily Replenishing Lotion  Eucerin Dry Skin Therapy Plus Alpha Hydroxy Crme  Eucerin  Dry Skin Therapy Plus Alpha Hydroxy Lotion  Eucerin Original Crme  Eucerin Original Lotion  Eucerin Plus Crme Eucerin Plus Lotion  Eucerin TriLipid Replenishing Lotion  Keri Anti-Bacterial Hand Lotion  Keri Deep Conditioning Original Lotion Dry Skin Formula Softly Scented  Keri Deep Conditioning Original Lotion, Fragrance Free Sensitive Skin Formula  Keri Lotion Fast Absorbing Fragrance Free Sensitive Skin Formula  Keri Lotion Fast Absorbing Softly Scented Dry Skin Formula  Keri Original Lotion  Keri Skin Renewal Lotion Keri Silky Smooth Lotion  Keri Silky Smooth Sensitive Skin Lotion  Nivea Body Creamy Conditioning Oil  Nivea Body Extra Enriched Lotion  Nivea Body Original Lotion  Nivea Body Sheer Moisturizing Lotion Nivea Crme  Nivea Skin Firming Lotion  NutraDerm 30 Skin Lotion  NutraDerm Skin Lotion  NutraDerm Therapeutic Skin Cream  NutraDerm Therapeutic Skin Lotion  ProShield Protective Hand Cream  Provon moisturizing lotion  Please read over the following fact sheets that you were given.

## 2024-02-15 ENCOUNTER — Encounter (HOSPITAL_COMMUNITY)
Admission: RE | Admit: 2024-02-15 | Discharge: 2024-02-15 | Disposition: A | Discharge status: Home / Self Care | Source: Ambulatory Visit | Attending: Neurosurgery | Admitting: Neurosurgery

## 2024-02-15 ENCOUNTER — Encounter (HOSPITAL_COMMUNITY): Payer: Self-pay

## 2024-02-15 ENCOUNTER — Other Ambulatory Visit: Payer: Self-pay

## 2024-02-15 VITALS — BP 157/90 | HR 77 | Temp 98.3°F | Resp 18 | Ht 69.5 in | Wt 144.0 lb

## 2024-02-15 DIAGNOSIS — Z951 Presence of aortocoronary bypass graft: Secondary | ICD-10-CM | POA: Diagnosis not present

## 2024-02-15 DIAGNOSIS — Z9889 Other specified postprocedural states: Secondary | ICD-10-CM | POA: Insufficient documentation

## 2024-02-15 DIAGNOSIS — Z96653 Presence of artificial knee joint, bilateral: Secondary | ICD-10-CM | POA: Insufficient documentation

## 2024-02-15 DIAGNOSIS — M069 Rheumatoid arthritis, unspecified: Secondary | ICD-10-CM | POA: Insufficient documentation

## 2024-02-15 DIAGNOSIS — I442 Atrioventricular block, complete: Secondary | ICD-10-CM | POA: Insufficient documentation

## 2024-02-15 DIAGNOSIS — Z01818 Encounter for other preprocedural examination: Secondary | ICD-10-CM | POA: Diagnosis present

## 2024-02-15 DIAGNOSIS — Z01812 Encounter for preprocedural laboratory examination: Secondary | ICD-10-CM | POA: Diagnosis not present

## 2024-02-15 DIAGNOSIS — I350 Nonrheumatic aortic (valve) stenosis: Secondary | ICD-10-CM | POA: Insufficient documentation

## 2024-02-15 DIAGNOSIS — M4317 Spondylolisthesis, lumbosacral region: Secondary | ICD-10-CM | POA: Diagnosis not present

## 2024-02-15 DIAGNOSIS — Z87442 Personal history of urinary calculi: Secondary | ICD-10-CM | POA: Insufficient documentation

## 2024-02-15 DIAGNOSIS — I10 Essential (primary) hypertension: Secondary | ICD-10-CM | POA: Insufficient documentation

## 2024-02-15 DIAGNOSIS — E039 Hypothyroidism, unspecified: Secondary | ICD-10-CM | POA: Insufficient documentation

## 2024-02-15 HISTORY — DX: Cardiac arrhythmia, unspecified: I49.9

## 2024-02-15 HISTORY — DX: Nonrheumatic aortic (valve) stenosis: I35.0

## 2024-02-15 HISTORY — DX: Depression, unspecified: F32.A

## 2024-02-15 HISTORY — DX: Presence of cardiac pacemaker: Z95.0

## 2024-02-15 LAB — TYPE AND SCREEN
ABO/RH(D): A POS
Antibody Screen: NEGATIVE

## 2024-02-15 LAB — BASIC METABOLIC PANEL
Anion gap: 7 (ref 5–15)
BUN: 18 mg/dL (ref 8–23)
CO2: 30 mmol/L (ref 22–32)
Calcium: 9.3 mg/dL (ref 8.9–10.3)
Chloride: 101 mmol/L (ref 98–111)
Creatinine, Ser: 0.96 mg/dL (ref 0.61–1.24)
GFR, Estimated: >60 mL/min (ref >60)
Glucose, Bld: 96 mg/dL (ref 70–99)
Potassium: 3.6 mmol/L (ref 3.5–5.1)
Sodium: 138 mmol/L (ref 135–145)

## 2024-02-15 LAB — CBC
HCT: 37.6 % — ABNORMAL LOW (ref 39.0–52.0)
Hemoglobin: 12.3 g/dL — ABNORMAL LOW (ref 13.0–17.0)
MCH: 30 pg (ref 26.0–34.0)
MCHC: 32.7 g/dL (ref 30.0–36.0)
MCV: 91.7 fL (ref 80.0–100.0)
Platelets: 234 10*3/uL (ref 150–400)
RBC: 4.1 MIL/uL — ABNORMAL LOW (ref 4.22–5.81)
RDW: 13.9 % (ref 11.5–15.5)
WBC: 4.6 10*3/uL (ref 4.0–10.5)
nRBC: 0 % (ref 0.0–0.2)

## 2024-02-15 LAB — SURGICAL PCR SCREEN
MRSA, PCR: POSITIVE — AB
Staphylococcus aureus: POSITIVE — AB

## 2024-02-15 NOTE — Progress Notes (Signed)
 Message left for [facility] Dr. Lovell Sheehan scheduler regarding PCR results

## 2024-02-15 NOTE — Progress Notes (Addendum)
 PCP - Dr. Burton Apley MD Cardiologist - Will Jorja Loa, MD   PPM/ICD - Abbott Device Orders - yes Rep Notified - yes rep is aware  Chest x-ray - 11-30-23 EKG - 01-11-24 Stress Test -  ECHO - 11-30-23 Cardiac Cath -   Sleep Study -  Denies CPAP -   DM - denies  Blood Thinner Instructions: denies Aspirin Instructions: n/a  ERAS Protcol - NPO   COVID TEST-    Anesthesia review:  yes  Patient denies shortness of breath, fever, cough and chest pain at PAT appointment   All instructions explained to the patient, with a verbal understanding of the material. Patient agrees to go over the instructions while at home for a better understanding. Patient also instructed to self quarantine after being tested for COVID-19. The opportunity to ask questions was provided.

## 2024-02-16 ENCOUNTER — Encounter (HOSPITAL_COMMUNITY): Payer: Self-pay

## 2024-02-16 NOTE — Progress Notes (Addendum)
 Anesthesia Chart Review:  Case: [id number] Date/Time: 02/22/24 0715   Procedure: PLIF,IP,POSTERIOR INSTRUMENTATION L5-S1; EXPLORE INSTRUMENTATION TO THE ILIUM - 3C   Anesthesia type: General   Pre-op diagnosis: SPONDYLOLISTHESIS, LUMBOSACRAL REGION   [facility] ROOM 20 / [facility] OR   Surgeons: Tressie Stalker, MD       DISCUSSION: Patient is an 83 year old male scheduled for the above procedure. 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 He was not having acute symptoms, so out-patient EP follow-up arranged with Dr. Elberta Fortis on 11/29/23. At that visit he reported feeling overall poor since the death of his wife a few months prior. Also reported weakness, fatigue, and exertional dyspnea. He underwent Abbott Assurity PPM on 11/30/23. He had EP follow-up and preoperative evaluation on 01/11/24 by Graciella Freer, PA-C. PPM with normal device function. In regards to surgery, he wrote: "Cardiac Clearance for Lumbar Fusion Pt may proceed without further cardiac work up.  We prefer pts to wait 6 weeks if possible after cardiac device, which is up after today. 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 Alcohol intake is documented as 1 shot per night.   For 07/20/22 intubation, he was classified as an anticipated difficult airway due to reduced neck mobility, anterior larynx, dentition, and due to limited oral opening. A Glidescope used to place 7.0 mm ETT. Mask ventilation without difficulty, Grade 1 view.   EP PPM Perioperative Recommendations: Device Information: Clinic EP Physician:  Loman Brooklyn, MD  Device Type:  Pacemaker Manufacturer and  Phone #:  St. Jude/Abbott: [phone] Pacemaker Dependent?:  Yes.   Date of Last Device Check:  12/21/2023     Normal Device Function?:  Yes.     Electrophysiologist's Recommendations:  Have magnet available. Provide continuous ECG monitoring when magnet is used or reprogramming is to be performed.  Device should be reprogrammed by industry representative to asynchronous pacing.    He is on Arava and prednisone 5 mg daily for RA.  Anesthesia team to evaluate on the day of surgery.   VS: BP (!) 157/90   Pulse 77   Temp 36.8 C   Resp 18   Ht 5' 9.5" (1.765 m)   Wt 65.3 kg   SpO2 98%   BMI 20.96 kg/m   PROVIDERS:  Burton Apley, MD is PCP  Loman Brooklyn, MD is EP cardiologist Alben Deeds, MD is rheumatologist Bernette Redbird, MD is GI   LABS: Labs reviewed: Acceptable for surgery. (all labs ordered are listed, but only abnormal results are displayed)  Labs Reviewed  SURGICAL PCR SCREEN - Abnormal; Notable for the following components:      Result Value   MRSA, PCR POSITIVE (*)    Staphylococcus aureus POSITIVE (*)    All other components within normal limits  CBC - Abnormal; Notable for the following components:   RBC 4.10 (*)    Hemoglobin 12.3 (*)    HCT 37.6 (*)    All other components within normal limits  BASIC METABOLIC PANEL  TYPE AND SCREEN     IMAGES: CXR 11/30/23: FINDINGS: There is a new left-sided pacemaker with leads ending in the right atrium and right ventricle. There is a new thoracic spinal stimulator device present. The lungs  are clear. There is no pleural effusion or pneumothorax. Lumbar fusion hardware is partially visualized. Cardiomediastinal silhouette is within normal limits. IMPRESSION: New left-sided pacemaker and thoracic spinal stimulator device. No pneumothorax.  CT L-spine 10/15/23:  IMPRESSION: 1. No acute finding. 2. Status post posterior instrumented fusion at L1 through L5 without evidence of complication or  convincing spinal canal stenosis. 3. 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    CV: Echo 11/30/23: IMPRESSIONS   1. Left ventricular ejection fraction, by estimation, is 70 to 75%. The  left ventricle has hyperdynamic function. The left ventricle has no  regional wall motion abnormalities. There is severe asymmetric left  ventricular hypertrophy of the basal-septal  segment. Left ventricular diastolic parameters are consistent with Grade I  diastolic dysfunction (impaired relaxation).   2. Right ventricular systolic function is normal. The right ventricular  size is normal. Tricuspid regurgitation signal is inadequate for assessing  PA pressure.   3. Left atrial size was mildly dilated.   4. The mitral valve is degenerative. No evidence of mitral valve  regurgitation. Severe mitral annular calcification.   5. The aortic valve is calcified. Aortic valve regurgitation is trivial.  Mild aortic valve stenosis. Aortic valve mean gradient measures 12.5 mmHg. Aortic valve peak gradient measures 24.3 mmHg. Aortic  valve area, by VTI measures 1.55 cm.   6. Aneurysm of the ascending aorta, measuring 46 mm.   7. The inferior vena cava is normal in size with greater than 50%  respiratory variability, suggesting right atrial pressure of 3 mmHg.    Past Medical History:  Diagnosis Date   Arthritis    Depression     Dysrhythmia    GERD (gastroesophageal reflux disease)    History of kidney stones    Hypertension    Hypothyroidism    PONV (postoperative nausea and vomiting)    Does not have it, if given Zofran and a patch   Presence of permanent cardiac pacemaker    RA (rheumatoid arthritis) (HCC)     Past Surgical History:  Procedure Laterality Date   BACK SURGERY     INSERT / REPLACE / REMOVE PACEMAKER     JOINT REPLACEMENT  2002 & 2003   both knees   PACEMAKER IMPLANT N/A 11/30/2023   Procedure: PACEMAKER IMPLANT;  Surgeon: Regan Lemming, MD;  Location: [facility] INVASIVE CV LAB;  Service: Cardiovascular;  Laterality: N/A;   TONSILLECTOMY      MEDICATIONS:  acetaminophen (TYLENOL) 650 MG CR tablet   Apoaequorin (PREVAGEN PO)   carboxymethylcellulose (REFRESH PLUS) 0.5 % SOLN   chlorthalidone (HYGROTON) 25 MG tablet   docusate sodium (COLACE) 100 MG capsule   folic acid (FOLVITE) 1 MG tablet   gabapentin (NEURONTIN) 100 MG capsule   leflunomide (ARAVA) 20 MG tablet   levothyroxine (SYNTHROID) 112 MCG tablet   lisinopril (ZESTRIL) 40 MG tablet   MELATONIN PO   Menthol, Topical Analgesic, (BIOFREEZE EX)   methocarbamol (ROBAXIN) 750 MG tablet   omeprazole (PRILOSEC) 20  MG capsule   ondansetron (ZOFRAN) 8 MG tablet   Oxycodone HCl 20 MG TABS   predniSONE (DELTASONE) 5 MG tablet   promethazine (PHENERGAN) 25 MG tablet   sertraline (ZOLOFT) 100 MG tablet   tamsulosin (FLOMAX) 0.4 MG CAPS capsule   No current facility-administered medications for this encounter.    Shonna Chock, PA-C Surgical Short Stay/Anesthesiology [facility] Phone [phone] [facility] Phone [phone] 02/19/2024 9:36 AM

## 2024-02-16 NOTE — Anesthesia Preprocedure Evaluation (Addendum)
 Anesthesia Evaluation  Patient identified by MRN, date of birth, ID band Patient awake    Reviewed: Allergy & Precautions, NPO status , Patient's Chart, lab work & pertinent test results  History of Anesthesia Complications (+) PONV, DIFFICULT AIRWAY and history of anesthetic complications  Airway Mallampati: II  TM Distance: >3 FB Neck ROM: Full    Dental   Pulmonary neg pulmonary ROS   breath sounds clear to auscultation       Cardiovascular hypertension, Pt. on medications + dysrhythmias + pacemaker  Rhythm:Regular Rate:Normal     Neuro/Psych negative neurological ROS     GI/Hepatic Neg liver ROS,GERD  ,,  Endo/Other  Hypothyroidism    Renal/GU negative Renal ROS     Musculoskeletal  (+) Arthritis ,    Abdominal   Peds  Hematology negative hematology ROS (+)   Anesthesia Other Findings   Reproductive/Obstetrics                             Anesthesia Physical Anesthesia Plan  ASA: 2  Anesthesia Plan: General   Post-op Pain Management: Tylenol PO (pre-op)*   Induction: Intravenous  PONV Risk Score and Plan: 3 and Dexamethasone, Ondansetron, Treatment may vary due to age or medical condition and Scopolamine patch - Pre-op  Airway Management Planned: Oral ETT  Additional Equipment: None  Intra-op Plan:   Post-operative Plan: Extubation in OR  Informed Consent: I have reviewed the patients History and Physical, chart, labs and discussed the procedure including the risks, benefits and alternatives for the proposed anesthesia with the patient or authorized representative who has indicated his/her understanding and acceptance.     Dental advisory given  Plan Discussed with: CRNA  Anesthesia Plan Comments: (  )       Anesthesia Quick Evaluation

## 2024-02-19 ENCOUNTER — Encounter (HOSPITAL_COMMUNITY): Payer: Self-pay

## 2024-02-22 ENCOUNTER — Other Ambulatory Visit: Payer: Self-pay

## 2024-02-22 ENCOUNTER — Inpatient Hospital Stay (HOSPITAL_COMMUNITY)

## 2024-02-22 ENCOUNTER — Encounter (HOSPITAL_COMMUNITY): Payer: Self-pay | Admitting: Neurosurgery

## 2024-02-22 ENCOUNTER — Observation Stay (HOSPITAL_COMMUNITY)
Admission: RE | Admit: 2024-02-22 | Discharge: 2024-02-23 | Disposition: A | Discharge status: Home / Self Care | Payer: Medicare Other | Attending: Neurosurgery | Admitting: Neurosurgery

## 2024-02-22 ENCOUNTER — Inpatient Hospital Stay (HOSPITAL_COMMUNITY): Admitting: Anesthesiology

## 2024-02-22 ENCOUNTER — Inpatient Hospital Stay (HOSPITAL_COMMUNITY): Payer: Self-pay | Admitting: Vascular Surgery

## 2024-02-22 ENCOUNTER — Ambulatory Visit (HOSPITAL_COMMUNITY)
Admission: RE | Disposition: A | Discharge status: Home / Self Care | Payer: Self-pay | Source: Home / Self Care | Attending: Neurosurgery

## 2024-02-22 DIAGNOSIS — M48062 Spinal stenosis, lumbar region with neurogenic claudication: Secondary | ICD-10-CM

## 2024-02-22 DIAGNOSIS — Z79899 Other long term (current) drug therapy: Secondary | ICD-10-CM | POA: Insufficient documentation

## 2024-02-22 DIAGNOSIS — M4807 Spinal stenosis, lumbosacral region: Secondary | ICD-10-CM | POA: Diagnosis not present

## 2024-02-22 DIAGNOSIS — Z96653 Presence of artificial knee joint, bilateral: Secondary | ICD-10-CM | POA: Insufficient documentation

## 2024-02-22 DIAGNOSIS — M5116 Intervertebral disc disorders with radiculopathy, lumbar region: Secondary | ICD-10-CM

## 2024-02-22 DIAGNOSIS — M5416 Radiculopathy, lumbar region: Secondary | ICD-10-CM | POA: Diagnosis not present

## 2024-02-22 DIAGNOSIS — M4317 Spondylolisthesis, lumbosacral region: Secondary | ICD-10-CM | POA: Diagnosis present

## 2024-02-22 DIAGNOSIS — Z950 Presence of cardiac pacemaker: Secondary | ICD-10-CM | POA: Insufficient documentation

## 2024-02-22 DIAGNOSIS — I10 Essential (primary) hypertension: Secondary | ICD-10-CM | POA: Diagnosis not present

## 2024-02-22 DIAGNOSIS — E039 Hypothyroidism, unspecified: Secondary | ICD-10-CM | POA: Diagnosis not present

## 2024-02-22 HISTORY — DX: Presence of other specified functional implants: Z96.89

## 2024-02-22 SURGERY — POSTERIOR LUMBAR FUSION 1 LEVEL
Anesthesia: General

## 2024-02-22 MED ORDER — ONDANSETRON HCL 4 MG/2ML IJ SOLN
4.0000 mg | Freq: Four times a day (QID) | INTRAMUSCULAR | Status: DC | PRN
  Administered 2024-02-22: 4 mg via INTRAVENOUS
  Filled 2024-02-22: qty 2

## 2024-02-22 MED ORDER — BUPIVACAINE LIPOSOME 1.3 % IJ SUSP
INTRAMUSCULAR | Status: DC | PRN
  Administered 2024-02-22: 20 mL

## 2024-02-22 MED ORDER — 0.9 % SODIUM CHLORIDE (POUR BTL) OPTIME
TOPICAL | Status: DC | PRN
  Administered 2024-02-22: 1000 mL

## 2024-02-22 MED ORDER — METHOCARBAMOL 750 MG PO TABS
1500.0000 mg | ORAL_TABLET | Freq: Three times a day (TID) | ORAL | Status: DC | PRN
  Administered 2024-02-22: 1500 mg via ORAL
  Filled 2024-02-22 (×2): qty 2

## 2024-02-22 MED ORDER — PROPOFOL 10 MG/ML IV BOLUS
INTRAVENOUS | Status: AC
  Filled 2024-02-22: qty 20

## 2024-02-22 MED ORDER — PREDNISONE 5 MG PO TABS: 5.0000 mg | ORAL_TABLET | Freq: Every day | ORAL | Status: DC

## 2024-02-22 MED ORDER — VANCOMYCIN HCL IN DEXTROSE 1-5 GM/200ML-% IV SOLN
1000.0000 mg | Freq: Once | INTRAVENOUS | Status: AC
  Administered 2024-02-22: 1000 mg via INTRAVENOUS
  Filled 2024-02-22: qty 200

## 2024-02-22 MED ORDER — FENTANYL CITRATE (PF) 250 MCG/5ML IJ SOLN
INTRAMUSCULAR | Status: AC
  Filled 2024-02-22: qty 5

## 2024-02-22 MED ORDER — SERTRALINE HCL 100 MG PO TABS
100.0000 mg | ORAL_TABLET | Freq: Every day | ORAL | Status: DC
  Administered 2024-02-22 – 2024-02-23 (×2): 100 mg via ORAL
  Filled 2024-02-22 (×2): qty 1

## 2024-02-22 MED ORDER — MORPHINE SULFATE (PF) 2 MG/ML IV SOLN
2.0000 mg | INTRAVENOUS | Status: DC | PRN
  Administered 2024-02-22: 2 mg via INTRAVENOUS
  Filled 2024-02-22: qty 1

## 2024-02-22 MED ORDER — PANTOPRAZOLE SODIUM 40 MG PO TBEC
40.0000 mg | DELAYED_RELEASE_TABLET | Freq: Every day | ORAL | Status: DC
  Administered 2024-02-22 – 2024-02-23 (×2): 40 mg via ORAL
  Filled 2024-02-22 (×2): qty 1

## 2024-02-22 MED ORDER — PROPOFOL 10 MG/ML IV BOLUS
INTRAVENOUS | Status: DC | PRN
  Administered 2024-02-22: 130 mg via INTRAVENOUS

## 2024-02-22 MED ORDER — CHLORHEXIDINE GLUCONATE CLOTH 2 % EX PADS: 6.0000 | MEDICATED_PAD | Freq: Once | CUTANEOUS | Status: DC

## 2024-02-22 MED ORDER — FENTANYL CITRATE (PF) 100 MCG/2ML IJ SOLN
INTRAMUSCULAR | Status: AC
  Filled 2024-02-22: qty 2

## 2024-02-22 MED ORDER — BACITRACIN ZINC 500 UNIT/GM EX OINT
TOPICAL_OINTMENT | CUTANEOUS | Status: AC
  Filled 2024-02-22: qty 28.35

## 2024-02-22 MED ORDER — GABAPENTIN 100 MG PO CAPS
100.0000 mg | ORAL_CAPSULE | Freq: Three times a day (TID) | ORAL | Status: DC
  Administered 2024-02-22 – 2024-02-23 (×3): 100 mg via ORAL
  Filled 2024-02-22 (×3): qty 1

## 2024-02-22 MED ORDER — THROMBIN 5000 UNITS EX SOLR
OROMUCOSAL | Status: DC | PRN
  Administered 2024-02-22 (×2): 5 mL via TOPICAL

## 2024-02-22 MED ORDER — ACETAMINOPHEN 325 MG PO TABS: 650.0000 mg | ORAL_TABLET | ORAL | Status: DC | PRN

## 2024-02-22 MED ORDER — PHENOL 1.4 % MT LIQD: 1.0000 | OROMUCOSAL | Status: DC | PRN

## 2024-02-22 MED ORDER — BUPIVACAINE-EPINEPHRINE (PF) 0.5% -1:200000 IJ SOLN
INTRAMUSCULAR | Status: AC
  Filled 2024-02-22: qty 30

## 2024-02-22 MED ORDER — ACETAMINOPHEN 650 MG RE SUPP: 650.0000 mg | RECTAL | Status: DC | PRN

## 2024-02-22 MED ORDER — ONDANSETRON HCL 4 MG PO TABS: 4.0000 mg | ORAL_TABLET | Freq: Four times a day (QID) | ORAL | Status: DC | PRN

## 2024-02-22 MED ORDER — LEFLUNOMIDE 10 MG PO TABS
20.0000 mg | ORAL_TABLET | Freq: Every day | ORAL | Status: DC
  Administered 2024-02-22: 20 mg via ORAL
  Filled 2024-02-22 (×2): qty 2

## 2024-02-22 MED ORDER — BUPIVACAINE LIPOSOME 1.3 % IJ SUSP
INTRAMUSCULAR | Status: AC
  Filled 2024-02-22: qty 20

## 2024-02-22 MED ORDER — ORAL CARE MOUTH RINSE: 15.0000 mL | Freq: Once | OROMUCOSAL | Status: AC

## 2024-02-22 MED ORDER — SODIUM CHLORIDE 0.9 % IV SOLN
250.0000 mL | INTRAVENOUS | Status: DC
  Administered 2024-02-22: 250 mL via INTRAVENOUS

## 2024-02-22 MED ORDER — ACETAMINOPHEN 500 MG PO TABS
1000.0000 mg | ORAL_TABLET | Freq: Four times a day (QID) | ORAL | Status: AC
  Administered 2024-02-22 – 2024-02-23 (×4): 1000 mg via ORAL
  Filled 2024-02-22 (×4): qty 2

## 2024-02-22 MED ORDER — HYDROCORTISONE SOD SUC (PF) 100 MG IJ SOLR
100.0000 mg | Freq: Three times a day (TID) | INTRAMUSCULAR | Status: DC
  Administered 2024-02-22 – 2024-02-23 (×2): 100 mg via INTRAVENOUS
  Filled 2024-02-22 (×4): qty 2

## 2024-02-22 MED ORDER — BUPIVACAINE-EPINEPHRINE (PF) 0.5% -1:200000 IJ SOLN
INTRAMUSCULAR | Status: DC | PRN
  Administered 2024-02-22: 10 mL via PERINEURAL

## 2024-02-22 MED ORDER — DEXAMETHASONE SODIUM PHOSPHATE 10 MG/ML IJ SOLN
INTRAMUSCULAR | Status: DC | PRN
  Administered 2024-02-22: 10 mg via INTRAVENOUS

## 2024-02-22 MED ORDER — PHENYLEPHRINE HCL-NACL 20-0.9 MG/250ML-% IV SOLN
INTRAVENOUS | Status: DC | PRN
  Administered 2024-02-22: 30 ug/min via INTRAVENOUS

## 2024-02-22 MED ORDER — EPHEDRINE SULFATE (PRESSORS) 50 MG/ML IJ SOLN
INTRAMUSCULAR | Status: DC | PRN
  Administered 2024-02-22 (×2): 5 mg via INTRAVENOUS

## 2024-02-22 MED ORDER — CEFAZOLIN SODIUM-DEXTROSE 2-4 GM/100ML-% IV SOLN
2.0000 g | Freq: Three times a day (TID) | INTRAVENOUS | Status: AC
  Administered 2024-02-22 – 2024-02-23 (×2): 2 g via INTRAVENOUS
  Filled 2024-02-22 (×2): qty 100

## 2024-02-22 MED ORDER — LEVOTHYROXINE SODIUM 112 MCG PO TABS
112.0000 ug | ORAL_TABLET | Freq: Every day | ORAL | Status: DC
  Administered 2024-02-23: 112 ug via ORAL
  Filled 2024-02-22: qty 1

## 2024-02-22 MED ORDER — FENTANYL CITRATE (PF) 250 MCG/5ML IJ SOLN
INTRAMUSCULAR | Status: DC | PRN
  Administered 2024-02-22: 100 ug via INTRAVENOUS
  Administered 2024-02-22 (×3): 50 ug via INTRAVENOUS

## 2024-02-22 MED ORDER — AMISULPRIDE (ANTIEMETIC) 5 MG/2ML IV SOLN: 10.0000 mg | Freq: Once | INTRAVENOUS | Status: DC | PRN

## 2024-02-22 MED ORDER — ACETAMINOPHEN 500 MG PO TABS
1000.0000 mg | ORAL_TABLET | Freq: Once | ORAL | Status: AC
  Administered 2024-02-22: 500 mg via ORAL
  Filled 2024-02-22: qty 2

## 2024-02-22 MED ORDER — ONDANSETRON HCL 4 MG/2ML IJ SOLN
INTRAMUSCULAR | Status: DC | PRN
  Administered 2024-02-22: 4 mg via INTRAVENOUS

## 2024-02-22 MED ORDER — FENTANYL CITRATE (PF) 100 MCG/2ML IJ SOLN
25.0000 ug | INTRAMUSCULAR | Status: DC | PRN
  Administered 2024-02-22: 50 ug via INTRAVENOUS
  Administered 2024-02-22 (×2): 25 ug via INTRAVENOUS

## 2024-02-22 MED ORDER — VASHE WOUND IRRIGATION OPTIME
TOPICAL | Status: DC | PRN
  Administered 2024-02-22: 34 [oz_av] via TOPICAL

## 2024-02-22 MED ORDER — SODIUM CHLORIDE 0.9% FLUSH: 3.0000 mL | INTRAVENOUS | Status: DC | PRN

## 2024-02-22 MED ORDER — BISACODYL 10 MG RE SUPP: 10.0000 mg | Freq: Every day | RECTAL | Status: DC | PRN

## 2024-02-22 MED ORDER — MENTHOL 3 MG MT LOZG: 1.0000 | LOZENGE | OROMUCOSAL | Status: DC | PRN

## 2024-02-22 MED ORDER — SODIUM CHLORIDE 0.9% FLUSH
3.0000 mL | Freq: Two times a day (BID) | INTRAVENOUS | Status: DC
  Administered 2024-02-22: 3 mL via INTRAVENOUS

## 2024-02-22 MED ORDER — TAMSULOSIN HCL 0.4 MG PO CAPS
0.4000 mg | ORAL_CAPSULE | Freq: Every day | ORAL | Status: DC
  Administered 2024-02-22 – 2024-02-23 (×2): 0.4 mg via ORAL
  Filled 2024-02-22 (×2): qty 1

## 2024-02-22 MED ORDER — LACTATED RINGERS IV SOLN: INTRAVENOUS | Status: DC

## 2024-02-22 MED ORDER — ROCURONIUM BROMIDE 100 MG/10ML IV SOLN
INTRAVENOUS | Status: DC | PRN
  Administered 2024-02-22: 40 mg via INTRAVENOUS
  Administered 2024-02-22: 30 mg via INTRAVENOUS
  Administered 2024-02-22 (×2): 20 mg via INTRAVENOUS
  Administered 2024-02-22: 10 mg via INTRAVENOUS

## 2024-02-22 MED ORDER — PHENYLEPHRINE 80 MCG/ML (10ML) SYRINGE FOR IV PUSH (FOR BLOOD PRESSURE SUPPORT)
PREFILLED_SYRINGE | INTRAVENOUS | Status: DC | PRN
  Administered 2024-02-22 (×3): 80 ug via INTRAVENOUS

## 2024-02-22 MED ORDER — ALBUMIN HUMAN 5 % IV SOLN: INTRAVENOUS | Status: DC | PRN

## 2024-02-22 MED ORDER — OXYCODONE HCL 5 MG PO TABS
10.0000 mg | ORAL_TABLET | ORAL | Status: DC | PRN
  Administered 2024-02-22: 20 mg via ORAL
  Administered 2024-02-22 – 2024-02-23 (×2): 10 mg via ORAL
  Administered 2024-02-23: 20 mg via ORAL
  Filled 2024-02-22: qty 2
  Filled 2024-02-22 (×2): qty 4
  Filled 2024-02-22: qty 2

## 2024-02-22 MED ORDER — CHLORHEXIDINE GLUCONATE 0.12 % MT SOLN
15.0000 mL | Freq: Once | OROMUCOSAL | Status: AC
  Administered 2024-02-22: 15 mL via OROMUCOSAL
  Filled 2024-02-22: qty 15

## 2024-02-22 MED ORDER — SUGAMMADEX SODIUM 200 MG/2ML IV SOLN
INTRAVENOUS | Status: DC | PRN
  Administered 2024-02-22 (×2): 100 mg via INTRAVENOUS

## 2024-02-22 MED ORDER — PHENYLEPHRINE HCL-NACL 20-0.9 MG/250ML-% IV SOLN
INTRAVENOUS | Status: AC
Start: 2024-02-22 — End: ?
  Filled 2024-02-22: qty 500

## 2024-02-22 MED ORDER — LISINOPRIL 20 MG PO TABS
40.0000 mg | ORAL_TABLET | Freq: Every day | ORAL | Status: DC
  Administered 2024-02-22 – 2024-02-23 (×2): 40 mg via ORAL
  Filled 2024-02-22 (×2): qty 2

## 2024-02-22 MED ORDER — DOCUSATE SODIUM 100 MG PO CAPS
100.0000 mg | ORAL_CAPSULE | Freq: Two times a day (BID) | ORAL | Status: DC
  Administered 2024-02-22 – 2024-02-23 (×2): 100 mg via ORAL
  Filled 2024-02-22 (×2): qty 1

## 2024-02-22 MED ORDER — THROMBIN 5000 UNITS EX KIT
PACK | CUTANEOUS | Status: AC
  Filled 2024-02-22: qty 1

## 2024-02-22 MED ORDER — CEFAZOLIN SODIUM-DEXTROSE 2-4 GM/100ML-% IV SOLN
2.0000 g | INTRAVENOUS | Status: AC
Start: 2024-02-22 — End: 2024-02-22
  Administered 2024-02-22: 2 g via INTRAVENOUS

## 2024-02-22 MED ORDER — CHLORTHALIDONE 25 MG PO TABS
25.0000 mg | ORAL_TABLET | Freq: Every day | ORAL | Status: DC
  Administered 2024-02-22 – 2024-02-23 (×2): 25 mg via ORAL
  Filled 2024-02-22 (×2): qty 1

## 2024-02-22 MED ORDER — LACTATED RINGERS IV SOLN: INTRAVENOUS | Status: DC | PRN

## 2024-02-22 MED ORDER — BACITRACIN ZINC 500 UNIT/GM EX OINT
TOPICAL_OINTMENT | CUTANEOUS | Status: DC | PRN
  Administered 2024-02-22: 1 via TOPICAL

## 2024-02-22 SURGICAL SUPPLY — 70 items
BAG COUNTER SPONGE SURGICOUNT (BAG) ×1 IMPLANT
BASKET BONE COLLECTION (BASKET) ×1 IMPLANT
BENZOIN TINCTURE PRP APPL 2/3 (GAUZE/BANDAGES/DRESSINGS) ×1 IMPLANT
BLADE CLIPPER SURG (BLADE) IMPLANT
BUR MATCHSTICK NEURO 3.0 LAGG (BURR) ×1 IMPLANT
BUR PRECISION FLUTE 6.0 (BURR) ×1 IMPLANT
CAGE ALTERA 10X31X9-13 15D (Cage) IMPLANT
CANISTER SUCT 3000ML PPV (MISCELLANEOUS) ×1 IMPLANT
CAP LOCK DLX THRD (Cap) IMPLANT
CLAMP CONNECTOR 6.5-6.5MM (Clamp) IMPLANT
CLAMP SINGLE ROD TO ROD (Clamp) IMPLANT
CLEANSER WND VASHE INSTL 34OZ (WOUND CARE) ×1 IMPLANT
CNTNR URN SCR LID CUP LEK RST (MISCELLANEOUS) ×1 IMPLANT
CONN CREO DLX HO 15 (Connector) ×2 IMPLANT
CONN CREO DLX HO 20 (Screw) ×2 IMPLANT
CONNECTOR CREO DLX HO 15 (Connector) IMPLANT
CONNECTOR CREO DLX HO 20 (Screw) IMPLANT
COVER BACK TABLE 60X90IN (DRAPES) ×1 IMPLANT
CREO DLX Head offset Connector, 20mm ×2 IMPLANT
DRAPE C-ARM 42X72 X-RAY (DRAPES) ×2 IMPLANT
DRAPE HALF SHEET 40X57 (DRAPES) ×1 IMPLANT
DRAPE LAPAROTOMY 100X72X124 (DRAPES) ×1 IMPLANT
DRAPE SURG 17X23 STRL (DRAPES) ×1 IMPLANT
DRSG OPSITE POSTOP 4X10 (GAUZE/BANDAGES/DRESSINGS) IMPLANT
DRSG OPSITE POSTOP 4X6 (GAUZE/BANDAGES/DRESSINGS) ×1 IMPLANT
ELECT BLADE 4.0 EZ CLEAN MEGAD (MISCELLANEOUS) ×1 IMPLANT
ELECT REM PT RETURN 9FT ADLT (ELECTROSURGICAL) ×2 IMPLANT
ELECTRODE BLDE 4.0 EZ CLN MEGD (MISCELLANEOUS) ×1 IMPLANT
ELECTRODE REM PT RTRN 9FT ADLT (ELECTROSURGICAL) ×1 IMPLANT
EVACUATOR 1/8 PVC DRAIN (DRAIN) IMPLANT
GAUZE 4X4 16PLY ~~LOC~~+RFID DBL (SPONGE) ×1 IMPLANT
GLOVE BIO SURGEON STRL SZ 6 (GLOVE) ×1 IMPLANT
GLOVE BIO SURGEON STRL SZ8 (GLOVE) ×2 IMPLANT
GLOVE BIO SURGEON STRL SZ8.5 (GLOVE) ×2 IMPLANT
GLOVE BIOGEL PI IND STRL 6.5 (GLOVE) ×1 IMPLANT
GLOVE EXAM NITRILE XL STR (GLOVE) IMPLANT
GOWN STRL REUS W/ TWL LRG LVL3 (GOWN DISPOSABLE) ×1 IMPLANT
GOWN STRL REUS W/ TWL XL LVL3 (GOWN DISPOSABLE) ×2 IMPLANT
GOWN STRL REUS W/TWL 2XL LVL3 (GOWN DISPOSABLE) IMPLANT
GRAFT TRIN ELITE MED MUSC TRAN (Graft) IMPLANT
HEMOSTAT POWDER KIT SURGIFOAM (HEMOSTASIS) ×1 IMPLANT
KIT BASIN OR (CUSTOM PROCEDURE TRAY) ×1 IMPLANT
KIT GRAFTMAG DEL NEURO DISP (NEUROSURGERY SUPPLIES) IMPLANT
KIT POSITION SURG JACKSON T1 (MISCELLANEOUS) ×1 IMPLANT
KIT TURNOVER KIT B (KITS) ×1 IMPLANT
NDL HYPO 21X1.5 SAFETY (NEEDLE) ×1 IMPLANT
NDL HYPO 22X1.5 SAFETY MO (MISCELLANEOUS) ×1 IMPLANT
NEEDLE HYPO 21X1.5 SAFETY (NEEDLE) ×1 IMPLANT
NEEDLE HYPO 22X1.5 SAFETY MO (MISCELLANEOUS) ×1 IMPLANT
NS IRRIG 1000ML POUR BTL (IV SOLUTION) ×1 IMPLANT
PACK LAMINECTOMY NEURO (CUSTOM PROCEDURE TRAY) ×1 IMPLANT
PAD ARMBOARD 7.5X6 YLW CONV (MISCELLANEOUS) ×3 IMPLANT
PATTIES SURGICAL .5 X1 (DISPOSABLE) IMPLANT
PUTTY DBM 10CC CALC GRAN (Putty) IMPLANT
ROD STRT CREO DLX 6.35X85 (Rod) IMPLANT
ROD STRT TI 6.35X100 (Rod) IMPLANT
SCREW PA DLX CREO 7.5X50 (Screw) IMPLANT
SCREW PA DLX CREO 9.5X80 (Screw) IMPLANT
SPIKE FLUID TRANSFER (MISCELLANEOUS) ×1 IMPLANT
SPONGE NEURO XRAY DETECT 1X3 (DISPOSABLE) IMPLANT
SPONGE SURGIFOAM ABS GEL 100 (HEMOSTASIS) IMPLANT
SPONGE T-LAP 4X18 ~~LOC~~+RFID (SPONGE) IMPLANT
STRIP CLOSURE SKIN 1/2X4 (GAUZE/BANDAGES/DRESSINGS) ×1 IMPLANT
SUT VIC AB 1 CT1 18XBRD ANBCTR (SUTURE) ×2 IMPLANT
SUT VIC AB 2-0 CP2 18 (SUTURE) ×2 IMPLANT
SYR 20ML LL LF (SYRINGE) IMPLANT
TOWEL GREEN STERILE (TOWEL DISPOSABLE) ×1 IMPLANT
TOWEL GREEN STERILE FF (TOWEL DISPOSABLE) ×1 IMPLANT
TRAY FOLEY MTR SLVR 16FR STAT (SET/KITS/TRAYS/PACK) ×1 IMPLANT
WATER STERILE IRR 1000ML POUR (IV SOLUTION) ×1 IMPLANT

## 2024-02-22 NOTE — Transfer of Care (Signed)
 Immediate Anesthesia Transfer of Care Note  Patient: RAYGEN DAHM  Procedure(s) Performed: POSTERIOR LUMBAR INTERBODY FUSION,INTERBODY PROSTHESIS, POSTERIOR INSTRUMENTATION LUMBAR FIVE-SACRAL ONE; EXPLORE INSTRUMENTATION TO THE ILIUM  Patient Location: PACU  Anesthesia Type:General  Level of Consciousness: awake  Airway & Oxygen Therapy: Patient Spontanous Breathing  Post-op Assessment: Report given to RN  Post vital signs: stable  Last Vitals:  Vitals Value Taken Time  BP 177/88 02/22/24 1315  Temp 37.2 C 02/22/24 1312  Pulse 70 02/22/24 1321  Resp 9 02/22/24 1321  SpO2 93 % 02/22/24 1321  Vitals shown include unfiled device data.  Last Pain:  Vitals:   02/22/24 0610  TempSrc:   PainSc: 8       Patients Stated Pain Goal: 4 (02/22/24 0981)  Complications: No notable events documented.

## 2024-02-22 NOTE — Op Note (Signed)
 Brief history: The patient is an 83 year old white male on whom I previously performed lumbar fusions.  He developed worsening back and leg pain consistent with a lumbosacral radiculopathy.  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  I discussed the various treatment options with him.  He has decided proceed with surgery.  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 Etc. requiring a wide decompression of the nerve roots.); at L5-S1 transforaminal lumbar interbody fusion with local morselized autograft bone and Zimmer DBM; insertion of interbody prosthesis at L5-S1 from the left (globus peek expandable interbody prosthesis); posterior segmental instrumentation from L1 to ilium with globus titanium pedicle screws and rods; posterior lateral arthrodesis at L5-S1 with Trinity bone graft extender, local morselized autograft bone and Zimmer DBM; exploration of lumbar fusion; insertion of bilateral S2 AI screws  Surgeon: Dr. Delma Officer  Asst.: Hildred Priest, NP  Anesthesia: Gen. endotracheal  Estimated blood loss: 250 cc  Drains: Medium Hemovac drain in the epidural space  Complications: None  Description of procedure: The patient was brought to the operating room by the anesthesia team. General endotracheal anesthesia was induced. The patient was turned to the prone position on the Wilson  frame. The patient's lumbosacral region was then prepared with Betadine scrub and Betadine solution. Sterile drapes were applied.  I then injected the area to be incised with Marcaine with epinephrine solution. I then used the scalpel to make a linear midline incision over the 3, L3-4, L4-5 and L5-S1 interspace, incising through the old surgical scar. I then used electrocautery to perform a bilateral subperiosteal dissection exposing the spinous process and lamina of L2-3, L3-4, L4-5 and L5-S1 and to expose the old hardware. We then inserted the Verstrac retractor to provide exposure.  I inspected then the arthrodesis at L2-3, L3-4 and L4-5.  It appeared solid.  I began the decompression by using the high speed drill to perform laminotomies at L5-S1 bilaterally. We then used the Kerrison punches to widen the laminotomy and removed the ligamentum flavum at L5-S1 bilaterally. We used the Kerrison punches to remove the medial facets at L5-S1 bilaterally. We performed wide foraminotomies about the bilateral L5-S1 bilaterally nerve roots completing the decompression.  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 I used a scalpel to incise the intervertebral disc at L5-S1 bilaterally. I then performed a partial intervertebral discectomy at L5-S1 bilaterally using the pituitary forceps. We prepared the vertebral endplates at L5-S1 bilaterally for the fusion by removing the soft tissues with the curettes. We then used the trial spacers to pick the appropriate sized interbody prosthesis. We prefilled his prosthesis with a combination of local morselized autograft bone that we obtained during the decompression as well as Zimmer DBM. We inserted the prefilled prosthesis into the interspace at L5-S1 from the left, we then turned and expanded the prosthesis. There was a good snug fit of the prosthesis in the  interspace. We then filled and the remainder of the intervertebral disc space with local morselized autograft bone and Zimmer DBM. This completed the posterior lumbar interbody arthrodesis.  During the decompression and insertion of the prosthesis the assistant protected the thecal sac and nerve roots with the D'Errico retractor.  We now turned attention to the instrumentation. Under fluoroscopic guidance we cannulated the bilateral S1 pedicles with the bone probe. We then removed the bone probe. We then tapped the pedicle with a 6.5 millimeter tap. We then removed the tap. We probed inside the tapped pedicle with a ball probe to rule out cortical breaches. We then inserted a 25 x 50 millimeter pedicle screw into the S1 pedicles bilaterally under fluoroscopic guidance. We then palpated along the medial aspect of the pedicles to rule out cortical breaches. There were none. The nerve roots were not injured.  We now turned attention to placement of the S2 AI screws.  Under fluoroscopic I inserted the pedicle probe just lateral to the S1 and S2 neural foramen into the "teardrop point.  We then removed the bone probe and tapped with a 8.5 mm tap.  I removed the tap.  I then inserted a 9.5 x 85 mm S2 AI screws bilaterally.  We got good bony purchase.  The we then connected the unilateral 5 pedicle screws and S2 AI screw the old rods using cross connectors.  I had to remove the old cross connector to stagger the rod connectors to the old rods.  I secured the rod in place with the caps. We then tightened the caps appropriately. This completed the instrumentation from L1 to the ilium bilaterally.  We now turned our attention to the posterior lateral arthrodesis at L5-S1. We used the high-speed drill to decorticate the remainder of the facets, pars, transverse process at L5-S1. We then applied a combination of Trinity bone graft extender, local morselized autograft bone and Zimmer DBM over these decorticated posterior  lateral structures. This completed the posterior lateral arthrodesis at L5-S1.  We then obtained hemostasis using bipolar electrocautery. We irrigated the wound out with vashe solution. We inspected the thecal sac and nerve roots and noted they were well decompressed. We then removed the retractor.  We placed a medium Hemovac drain in the epidural space and tunneled it out through a separate stab wound.  We injected Exparel . We reapproximated patient's thoracolumbar fascia with interrupted #1 Vicryl suture. We reapproximated patient's subcutaneous tissue with interrupted 2-0 Vicryl suture. The reapproximated patient's skin with Steri-Strips and benzoin. The wound was then coated with bacitracin ointment. A sterile dressing was applied. The drapes were removed. The patient was subsequently returned to the supine position where they were extubated by the anesthesia team. He was then transported to the post anesthesia care unit in stable condition. All sponge instrument and needle counts were reportedly correct at the end of this case.

## 2024-02-22 NOTE — H&P (Signed)
 Subjective: The patient is a 83 year old white male whose had multiple prior back surgeries.  He has developed worsening back and bilateral leg pain consistent with lumbosacral radiculopathy.  He has failed medical management.  He was worked up with a lumbar mild CT which demonstrated L5-S1 spondylolisthesis with foraminal stenosis as well as a herniated disc.  We discussed the various treatment options including surgery.  He has decided to proceed with surgery.  Past Medical History:  Diagnosis Date   Aortic stenosis    mild AS 11/2023   Arthritis    Depression    Difficult intubation 07/20/2022   Glidescope used for anticipated difficult airway due to reduced neck mobility, limited oral opening, anterior larynx   Dysrhythmia    GERD (gastroesophageal reflux disease)    History of kidney stones    Hypertension    Hypothyroidism    PONV (postoperative nausea and vomiting)    Does not have it, if given Zofran and a patch   Presence of permanent cardiac pacemaker    RA (rheumatoid arthritis) (HCC)     Past Surgical History:  Procedure Laterality Date   BACK SURGERY     INSERT / REPLACE / REMOVE PACEMAKER     JOINT REPLACEMENT  2002 & 2003   both knees   PACEMAKER IMPLANT N/A 11/30/2023   Procedure: PACEMAKER IMPLANT;  Surgeon: Regan Lemming, MD;  Location: [facility];  Service: Cardiovascular;  Laterality: N/A;   TONSILLECTOMY      Allergies  Allergen Reactions   Erythromycin Nausea Only   Erythromycin Base Nausea Only    Social History   Tobacco Use   Smoking status: Never   Smokeless tobacco: Never  Substance Use Topics   Alcohol use: Yes    Alcohol/week: 7.0 standard drinks of alcohol    Types: 7 Glasses of wine per week    Comment: one shot of alcohol at night    History reviewed. No pertinent family history. Prior to Admission medications   Medication Sig Start Date End Date Taking? Authorizing Provider  acetaminophen (TYLENOL) 650 MG CR tablet Take  650-1,300 mg by mouth 2 (two) times daily as needed for pain.   Yes [provider]  Apoaequorin (PREVAGEN PO) Take 1 tablet by mouth daily.   Yes [provider]  carboxymethylcellulose (REFRESH PLUS) 0.5 % SOLN Place 1 drop into both eyes 3 (three) times daily as needed (dry eyes).   Yes [provider]  chlorthalidone (HYGROTON) 25 MG tablet Take 25 mg by mouth daily.   Yes [provider]  docusate sodium (COLACE) 100 MG capsule Take 1 capsule (100 mg total) by mouth 2 (two) times daily. 07/22/22  Yes Tressie Stalker, MD  folic acid (FOLVITE) 1 MG tablet Take 1 mg by mouth in the morning and at bedtime.   Yes [provider]  gabapentin (NEURONTIN) 100 MG capsule Take 100 mg by mouth 3 (three) times daily. 11/14/23  Yes [provider]  leflunomide (ARAVA) 20 MG tablet Take 20 mg by mouth daily.   Yes [provider]  levothyroxine (SYNTHROID) 112 MCG tablet Take 112 mcg by mouth daily before breakfast.   Yes [provider]  lisinopril (ZESTRIL) 40 MG tablet Take 40 mg by mouth daily.   Yes [provider]  MELATONIN PO Take 10 mg by mouth.   Yes [provider]  Menthol, Topical Analgesic, (BIOFREEZE EX) Apply 1 Application topically as needed (back pain.).   Yes [provider]  methocarbamol (ROBAXIN) 750 MG tablet Take 1,500 mg by mouth in the morning and at bedtime.   Yes [provider]  omeprazole (PRILOSEC) 20 MG capsule Take 20 mg by mouth daily as needed (acid reflux).   Yes [provider]  ondansetron (ZOFRAN) 8 MG tablet Take 8 mg by mouth 2 (two) times daily as needed for nausea or vomiting. 11/26/23  Yes [provider]  Oxycodone HCl 20 MG TABS Take 1 tablet (20 mg total) by mouth every 6 (six) hours. Patient taking differently: Take 20 mg by mouth 3 (three) times daily as needed (back pain.). 10/15/23  Yes Durwin Glaze, MD  predniSONE (DELTASONE) 5 MG  tablet Take 5 mg by mouth daily with breakfast.   Yes [provider]  promethazine (PHENERGAN) 25 MG tablet Take 25 mg by mouth 3 (three) times daily as needed for nausea. 12/20/23  Yes [provider]  sertraline (ZOLOFT) 100 MG tablet Take 100 mg by mouth daily.   Yes [provider]  tamsulosin (FLOMAX) 0.4 MG CAPS capsule Take 0.4 mg by mouth daily.   Yes [provider]     Review of Systems  Positive ROS: As above  All other systems have been reviewed and were otherwise negative with the exception of those mentioned in the HPI and as above.  Objective: Vital signs in last 24 hours: Temp:  [98.1 F (36.7 C)] 98.1 F (36.7 C) (03/13 0553) Pulse Rate:  [73] 73 (03/13 0553) Resp:  [16] 16 (03/13 0553) BP: (159)/(74) 159/74 (03/13 0553) SpO2:  [94 %] 94 % (03/13 0553) Weight:  [63.5 kg] 63.5 kg (03/13 0553) Estimated body mass index is 20.38 kg/m as calculated from the following:   Height as of this encounter: 5' 9.5" (1.765 m).   Weight as of this encounter: 63.5 kg.   General Appearance: Alert Head: Normocephalic, without obvious abnormality, atraumatic Eyes: PERRL, conjunctiva/corneas clear, EOM's intact,    Ears: Normal  Throat: Normal  Neck: Supple, Back: His lumbar incision is well-healed. Lungs: Clear to auscultation bilaterally, respirations unlabored Heart: Regular rate and rhythm, no murmur, rub or gallop Abdomen: Soft, non-tender Extremities: Extremities normal, atraumatic, no cyanosis or edema Skin: unremarkable  NEUROLOGIC:   Mental status: alert and oriented,Motor Exam - grossly normal Sensory Exam - grossly normal Reflexes:  Coordination - grossly normal Gait - grossly normal Balance - grossly normal Cranial Nerves: I: smell Not tested  II: visual acuity  OS: Normal  OD: Normal   II: visual fields Full to confrontation  II: pupils Equal, round, reactive to light  III,VII: ptosis None  III,IV,VI: extraocular  muscles  Full ROM  V: mastication Normal  V: facial light touch sensation  Normal  V,VII: corneal reflex  Present  VII: facial muscle function - upper  Normal  VII: facial muscle function - lower Normal  VIII: hearing Not tested  IX: soft palate elevation  Normal  IX,X: gag reflex Present  XI: trapezius strength  5/5  XI: sternocleidomastoid strength 5/5  XI: neck flexion strength  5/5  XII: tongue strength  Normal    Data Review Lab Results  Component Value Date   WBC 4.6 02/15/2024   HGB 12.3 (L) 02/15/2024   HCT 37.6 (L) 02/15/2024   MCV 91.7 02/15/2024   PLT 234 02/15/2024   Lab Results  Component Value Date   NA 138 02/15/2024   K 3.6 02/15/2024   CL 101 02/15/2024   CO2 30  02/15/2024   BUN 18 02/15/2024   CREATININE 0.96 02/15/2024   GLUCOSE 96 02/15/2024   No results found for: "INR", "PROTIME"  Assessment/Plan: Lumbosacral spine listhesis, lumbosacral stenosis, lumbosacral radiculopathy, lumbago: I have discussed the situation with the patient.  I reviewed his imaging studies with him and pointed out the abnormalities.  We have discussed the various treatment options including surgery.  I have described the surgical treatment option of an extension of his decompression fusion to the ilium.  I have shown him surgical models.  I have given him a surgical pamphlet.  We have discussed the risk, benefits, alternatives, expected postop course, and likelihood of achieving our goals with surgery.  I have answered all his questions.  He has decided proceed with surgery.   Cristi Loron 02/22/2024 7:29 AM

## 2024-02-22 NOTE — Anesthesia Postprocedure Evaluation (Signed)
 Anesthesia Post Note  Patient: George Johnston  Procedure(s) Performed: POSTERIOR LUMBAR INTERBODY FUSION,INTERBODY PROSTHESIS, POSTERIOR INSTRUMENTATION LUMBAR FIVE-SACRAL ONE; EXPLORE INSTRUMENTATION TO THE ILIUM     Patient location during evaluation: PACU Anesthesia Type: General Level of consciousness: awake and alert Pain management: pain level controlled Vital Signs Assessment: post-procedure vital signs reviewed and stable Respiratory status: spontaneous breathing, nonlabored ventilation, respiratory function stable and patient connected to nasal cannula oxygen Cardiovascular status: blood pressure returned to baseline and stable Postop Assessment: no apparent nausea or vomiting Anesthetic complications: no  No notable events documented.  Last Vitals:  Vitals:   02/22/24 1600 02/22/24 1601  BP: (!) 170/83 (!) 165/85  Pulse: 81 81  Resp: 14 11  Temp:    SpO2: 100% 97%    Last Pain:  Vitals:   02/22/24 1430  TempSrc:   PainSc: 0-No pain                 Kennieth Rad

## 2024-02-23 DIAGNOSIS — M4317 Spondylolisthesis, lumbosacral region: Secondary | ICD-10-CM | POA: Diagnosis not present

## 2024-02-23 MED ORDER — METHOCARBAMOL 750 MG PO TABS: 1500.0000 mg | ORAL_TABLET | Freq: Two times a day (BID) | ORAL | 0 refills | Status: AC

## 2024-02-23 MED ORDER — OXYCODONE HCL 10 MG PO TABS: 10.0000 mg | ORAL_TABLET | ORAL | 0 refills | Status: AC | PRN

## 2024-02-23 MED ORDER — CALCIUM CARBONATE ANTACID 500 MG PO CHEW
400.0000 mg | CHEWABLE_TABLET | Freq: Three times a day (TID) | ORAL | Status: DC
  Administered 2024-02-23: 400 mg via ORAL
  Filled 2024-02-23: qty 2

## 2024-02-23 MED ORDER — MUPIROCIN 2 % EX OINT
TOPICAL_OINTMENT | Freq: Two times a day (BID) | CUTANEOUS | Status: DC
  Filled 2024-02-23: qty 22

## 2024-02-23 MED ORDER — DOCUSATE SODIUM 100 MG PO CAPS: 100.0000 mg | ORAL_CAPSULE | Freq: Two times a day (BID) | ORAL | 0 refills | Status: AC

## 2024-02-23 MED FILL — Thrombin For Soln 5000 Unit: CUTANEOUS | Qty: 5000 | Status: AC

## 2024-02-23 NOTE — Progress Notes (Signed)
 Patient alert and oriented. Void, ambulate. Surgical site clean and dry no sign of infection. Drain removed, d/c instructions explain and given to the patient all questions answered.

## 2024-02-23 NOTE — Evaluation (Signed)
 Occupational Therapy Evaluation Patient Details Name: George Johnston MRN: [id number] DOB: 1941-10-23 Today's Date: 02/23/2024   History of Present Illness   Pt is an 83 y/o male who presents s/p L5-S1 PLIF on 02/22/2024. PMH significant for Aortic stenosis, HTN, hypothyroidism, permanent cardiac pacemaker, RA, spinal cord stimulator placement, B TKA.     Clinical Impressions Zaydan was evaluated s/p the above spine surgery. He is indep and lives with his daughter and granddaughter at baseline. Upon evaluation pt was limited by surgical pain, spinal precautions, knowledge of compensatory techniques, unsteady gait and generalized weakness. Overall he needed generalized superivsion A for mobility with RW and for ADLs with use to maintain spinal precautions. Provided cues and education on spinal precautions and compensatory techniques throughout, handout provided and pt demonstrated limited recall during ADLs and mobility. Pt does not require further acute OT services. Recommend d/c home with support of family.       If plan is discharge home, recommend the following:   Assistance with cooking/housework;Assist for transportation     Functional Status Assessment   Patient has had a recent decline in their functional status and demonstrates the ability to make significant improvements in function in a reasonable and predictable amount of time.     Equipment Recommendations   None recommended by OT      Precautions/Restrictions   Precautions Precautions: Fall;Back Precaution Booklet Issued: Yes (comment) Recall of Precautions/Restrictions: Intact Required Braces or Orthoses: Spinal Brace Spinal Brace: Lumbar corset;Applied in sitting position Restrictions Weight Bearing Restrictions Per Provider Order: No     Mobility Bed Mobility               General bed mobility comments: OOB upon arrival    Transfers Overall transfer level: Needs assistance Equipment used:  Rolling walker (2 wheels) Transfers: Sit to/from Stand Sit to Stand: Supervision                  Balance Overall balance assessment: Needs assistance Sitting-balance support: Feet supported Sitting balance-Leahy Scale: Good     Standing balance support: Single extremity supported, During functional activity Standing balance-Leahy Scale: Fair                             ADL either performed or assessed with clinical judgement   ADL Overall ADL's : Needs assistance/impaired Eating/Feeding: Independent   Grooming: Supervision/safety;Standing   Upper Body Bathing: Set up;Sitting   Lower Body Bathing: Supervison/ safety;Sit to/from stand   Upper Body Dressing : Set up;Sitting   Lower Body Dressing: Supervision/safety;Sit to/from stand   Toilet Transfer: Supervision/safety;Ambulation   Toileting- Clothing Manipulation and Hygiene: Supervision/safety;Sit to/from stand       Functional mobility during ADLs: Supervision/safety;Rolling walker (2 wheels) General ADL Comments: superivsion A for safety, pt needed cues throughout to maintain spinal precautions     Vision Baseline Vision/History: 0 No visual deficits Vision Assessment?: No apparent visual deficits     Perception Perception: Not tested       Praxis Praxis: Not tested       Pertinent Vitals/Pain Pain Assessment Pain Assessment: Faces Faces Pain Scale: Hurts little more Pain Location: back Pain Descriptors / Indicators: Operative site guarding, Sore Pain Intervention(s): Limited activity within patient's tolerance     Extremity/Trunk Assessment Upper Extremity Assessment Upper Extremity Assessment: Overall WFL for tasks assessed   Lower Extremity Assessment Lower Extremity Assessment: Defer to PT evaluation   Cervical / Trunk Assessment Cervical /  Trunk Assessment: Back Surgery   Communication Communication Communication: No apparent difficulties   Cognition Arousal:  Alert Behavior During Therapy: WFL for tasks assessed/performed Cognition: No apparent impairments             OT - Cognition Comments: Overall WFL. however needed minimal cues to maintain BLT during ADLs. Pt not receptive to "no driving" stating that his car has all of the "bells and whistles" to alert him if he is about to hit something. pt reports being forgetful.                 Following commands: Intact       Cueing  General Comments      VSS on RA           Home Living Family/patient expects to be discharged to:: Private residence Living Arrangements: Children;Other relatives Available Help at Discharge: Family;Available 24 hours/day Type of Home: House Home Access: Stairs to enter [employer] of Steps: 2 Entrance Stairs-Rails: Left Home Layout: Two level;Able to live on main level with bedroom/bathroom Alternate Level Stairs-Number of Steps: flight   Bathroom Shower/Tub: Arts development officer Toilet: Handicapped height     Home Equipment: Agricultural consultant (2 wheels);Rollator (4 wheels);Shower seat - built in          Prior Functioning/Environment Prior Level of Function : Independent/Modified Independent             Mobility Comments: No AD but pt furniture cruising and pulling self up with arms to get up the stairs ADLs Comments: Independent, drives    OT Problem List: Decreased activity tolerance;Decreased range of motion;Decreased cognition;Decreased safety awareness;Decreased knowledge of precautions        OT Goals(Current goals can be found in the care plan section)   Acute Rehab OT Goals Patient Stated Goal: home OT Goal Formulation: With patient Time For Goal Achievement: 02/23/24 Potential to Achieve Goals: Good   AM-PAC OT "6 Clicks" Daily Activity     Outcome Measure Help from another person eating meals?: None Help from another person taking care of personal grooming?: A Little Help from another person  toileting, which includes using toliet, bedpan, or urinal?: A Little Help from another person bathing (including washing, rinsing, drying)?: A Little Help from another person to put on and taking off regular upper body clothing?: A Little Help from another person to put on and taking off regular lower body clothing?: A Little 6 Click Score: 19   End of Session Equipment Utilized During Treatment: Rolling walker (2 wheels);Back brace Nurse Communication: Mobility status  Activity Tolerance: Patient tolerated treatment well Patient left: in chair;with call bell/phone within reach  OT Visit Diagnosis: Unsteadiness on feet (R26.81);Pain                Time: 4540-9811 OT Time Calculation (min): 17 min Charges:  OT General Charges $OT Visit: 1 Visit OT Evaluation $OT Eval Moderate Complexity: 1 Mod  Derenda Mis, OTR/L Acute Rehabilitation Services Office [phone] Secure Chat Communication Preferred   Donia Pounds 02/23/2024, 11:11 AM

## 2024-02-23 NOTE — Progress Notes (Signed)
 Subjective: The patient is alert and pleasant.  He looks well.  He might want to go home later on today.  Objective: Vital signs in last 24 hours: Temp:  [97.7 F (36.5 C)-98.9 F (37.2 C)] 98.1 F (36.7 C) (03/14 0424) Pulse Rate:  [65-92] 65 (03/14 0424) Resp:  [10-18] 18 (03/14 0424) BP: (120-177)/(70-92) 139/76 (03/14 0424) SpO2:  [90 %-100 %] 96 % (03/14 0424) Estimated body mass index is 20.38 kg/m as calculated from the following:   Height as of this encounter: 5' 9.5" (1.765 m).   Weight as of this encounter: 63.5 kg.   Intake/Output from previous day: 03/13 0701 - 03/14 0700 In: 2370 [P.O.:720; I.V.:1400; IV Piggyback:250] Out: 1485 [Urine:925; Drains:310; Blood:250] Intake/Output this shift: Total I/O In: 720 [P.O.:720] Out: 160 [Drains:160]  Physical exam the patient is alert and oriented.  His strength is normal.  His dressing has a small blood stain.  Lab Results: No results for input(s): "WBC", "HGB", "HCT", "PLT" in the last 72 hours. BMET No results for input(s): "NA", "K", "CL", "CO2", "GLUCOSE", "BUN", "CREATININE", "CALCIUM" in the last 72 hours.  Studies/Results: DG Lumbar Spine 2-3 Views Result Date: 02/22/2024 CLINICAL DATA:  Elective surgery. EXAM: LUMBAR SPINE - 2-3 VIEW COMPARISON:  Radiograph 11/03/2023 FINDINGS: Five fluoroscopic spot views of the lumbar spine submitted from the operating room. Multilevel posterior rod and intrapedicular screw fusion with interbody spacers. There are new bilateral iliac screws. Fluoroscopy time 1 minutes 38 seconds. Dose 37.2 mGy. IMPRESSION: Intraoperative fluoroscopy during lumbar surgery. Electronically Signed   By: Narda Rutherford M.D.   On: 02/22/2024 14:54   DG C-Arm 1-60 Min-No Report Result Date: 02/22/2024 Fluoroscopy was utilized by the requesting physician.  No radiographic interpretation.   DG C-Arm 1-60 Min-No Report Result Date: 02/22/2024 Fluoroscopy was utilized by the requesting physician.  No  radiographic interpretation.    Assessment/Plan: The patient is doing well postop day #1.  He might go home later on today after he works with PT.  I gave him his discharge instructions and answered all his questions.  LOS: 1 day     Cristi Loron 02/23/2024, 6:42 AM     Patient ID: George Johnston, male   DOB: Aug 04, 1941, 83 y.o.   MRN: [id number]

## 2024-02-23 NOTE — Discharge Summary (Signed)
 Physician Discharge Summary     Providing Compassionate, Quality Care - Together   Patient ID: George Johnston MRN: [id number] DOB/AGE: 08-26-1941 83 y.o.  Admit date: 02/22/2024 Discharge date: 02/23/2024  Admission Diagnoses: Spondylolisthesis of lumbosacral region  Discharge Diagnoses:  Principal Problem:   Spondylolisthesis of lumbosacral region   Discharged Condition: good  Hospital Course: Patient underwent L5-S1 posterio lumbar fusion by Dr. Lovell Sheehan on 02/22/2024. He was admitted to 3C05  following recovery from anesthesia in the PACU. His postoperative course has been uncomplicated. He has worked with both physical and occupational therapies who feel the patient is ready for discharge home. He is ambulating with minimal assist. He is tolerating a normal diet. He is not having any bowel or bladder dysfunction. His pain is well-controlled with oral pain medication. He is ready for discharge home.   Consults: PT/OT/TOC  Significant Diagnostic Studies: radiology: DG Lumbar Spine 2-3 Views Result Date: 02/22/2024 CLINICAL DATA:  Elective surgery. EXAM: LUMBAR SPINE - 2-3 VIEW COMPARISON:  Radiograph 11/03/2023 FINDINGS: Five fluoroscopic spot views of the lumbar spine submitted from the operating room. Multilevel posterior rod and intrapedicular screw fusion with interbody spacers. There are new bilateral iliac screws. Fluoroscopy time 1 minutes 38 seconds. Dose 37.2 mGy. IMPRESSION: Intraoperative fluoroscopy during lumbar surgery. Electronically Signed   By: Narda Rutherford M.D.   On: 02/22/2024 14:54   DG C-Arm 1-60 Min-No Report Result Date: 02/22/2024 Fluoroscopy was utilized by the requesting physician.  No radiographic interpretation.   DG C-Arm 1-60 Min-No Report Result Date: 02/22/2024 Fluoroscopy was utilized by the requesting physician.  No radiographic interpretation.     Treatments: surgery: Bilateral L5-S1 laminotomy/foraminotomies/medial facetectomy to decompress  the bilateral L5 and S1 nerve roots(the work required to do this was in addition to the work required to do the posterior lumbar interbody fusion because of the patient's spinal stenosis, facet arthropathy. Etc. requiring a wide decompression of the nerve roots.); at L5-S1 transforaminal lumbar interbody fusion with local morselized autograft bone and Zimmer DBM; insertion of interbody prosthesis at L5-S1 from the left (globus peek expandable interbody prosthesis); posterior segmental instrumentation from L1 to ilium with globus titanium pedicle screws and rods; posterior lateral arthrodesis at L5-S1 with Trinity bone graft extender, local morselized autograft bone and Zimmer DBM; exploration of lumbar fusion; insertion of bilateral S2 AI screws   Discharge Exam: Blood pressure (!) 158/75, pulse 69, temperature 98.3 F (36.8 C), temperature source Oral, resp. rate 20, height 5' 9.5" (1.765 m), weight 63.5 kg, SpO2 97%.  Per report: Alert and oriented PERRLA MAE, Strength and sensation intact Incision is covered with Honeycomb dressing and Steri Strips; Dressing is intact with a small amount of sanguinous drainage   Disposition: Discharge disposition: 01-Home or Self Care       Discharge Instructions     Call MD for:  difficulty breathing, headache or visual disturbances   Complete by: As directed    Call MD for:  hives   Complete by: As directed    Call MD for:  persistant dizziness or light-headedness   Complete by: As directed    Call MD for:  persistant nausea and vomiting   Complete by: As directed    Call MD for:  redness, tenderness, or signs of infection (pain, swelling, redness, odor or green/yellow discharge around incision site)   Complete by: As directed    Call MD for:  severe uncontrolled pain   Complete by: As directed    Diet - low sodium  heart healthy   Complete by: As directed    If the dressing is still on your incision site when you go home, remove it on the  third day after your surgery date. Remove dressing if it begins to fall off, or if it is dirty or damaged before the third day.   Complete by: As directed    Increase activity slowly   Complete by: As directed       Allergies as of 02/23/2024       Reactions   Erythromycin Nausea Only   Erythromycin Base Nausea Only        Medication List     TAKE these medications    acetaminophen 650 MG CR tablet Commonly known as: TYLENOL Take 650-1,300 mg by mouth 2 (two) times daily as needed for pain.   BIOFREEZE EX Apply 1 Application topically as needed (back pain.).   carboxymethylcellulose 0.5 % Soln Commonly known as: REFRESH PLUS Place 1 drop into both eyes 3 (three) times daily as needed (dry eyes).   chlorthalidone 25 MG tablet Commonly known as: HYGROTON Take 25 mg by mouth daily.   docusate sodium 100 MG capsule Commonly known as: COLACE Take 1 capsule (100 mg total) by mouth 2 (two) times daily.   folic acid 1 MG tablet Commonly known as: FOLVITE Take 1 mg by mouth in the morning and at bedtime.   gabapentin 100 MG capsule Commonly known as: NEURONTIN Take 100 mg by mouth 3 (three) times daily.   leflunomide 20 MG tablet Commonly known as: ARAVA Take 20 mg by mouth daily.   levothyroxine 112 MCG tablet Commonly known as: SYNTHROID Take 112 mcg by mouth daily before breakfast.   lisinopril 40 MG tablet Commonly known as: ZESTRIL Take 40 mg by mouth daily.   MELATONIN PO Take 10 mg by mouth.   methocarbamol 750 MG tablet Commonly known as: ROBAXIN Take 2 tablets (1,500 mg total) by mouth in the morning and at bedtime.   omeprazole 20 MG capsule Commonly known as: PRILOSEC Take 20 mg by mouth daily as needed (acid reflux).   ondansetron 8 MG tablet Commonly known as: ZOFRAN Take 8 mg by mouth 2 (two) times daily as needed for nausea or vomiting.   Oxycodone HCl 10 MG Tabs Take 1-2 tablets (10-20 mg total) by mouth every 4 (four) hours as needed  for severe pain (pain score 7-10) (For postoperative pain). What changed:  medication strength how much to take when to take this reasons to take this   predniSONE 5 MG tablet Commonly known as: DELTASONE Take 5 mg by mouth daily with breakfast.   PREVAGEN PO Take 1 tablet by mouth daily.   promethazine 25 MG tablet Commonly known as: PHENERGAN Take 25 mg by mouth 3 (three) times daily as needed for nausea.   sertraline 100 MG tablet Commonly known as: ZOLOFT Take 100 mg by mouth daily.   tamsulosin 0.4 MG Caps capsule Commonly known as: FLOMAX Take 0.4 mg by mouth daily.               Discharge Care Instructions  (From admission, onward)           Start     Ordered   02/23/24 0000  If the dressing is still on your incision site when you go home, remove it on the third day after your surgery date. Remove dressing if it begins to fall off, or if it is dirty or damaged before the  third day.        02/23/24 1122            Follow-up Information     Tressie Stalker, MD. Go on 03/19/2024.   Specialty: Neurosurgery Why: First post op appointment with x-rays is on 03/19/2024 at 2:00 PM. Contact information: [address] [place] [address] [phone]                 Signed: Val Eagle, DNP, AGNP-C Nurse Practitioner  [facility] [address], [address], [place], [place] [address] P: [phone]    F: [phone]  02/23/2024, 11:22 AM

## 2024-02-23 NOTE — Care Management Obs Status (Signed)
 MEDICARE OBSERVATION STATUS NOTIFICATION   Patient Details  Name: George Johnston MRN: [id number] Date of Birth: Jan 23, 1941   Medicare Observation Status Notification Given:  Yes    Lockie Pares, RN 02/23/2024, 12:41 PM

## 2024-02-23 NOTE — Discharge Instructions (Signed)
Wound Care Keep incision covered and dry until post op day 3. You may remove the Honeycomb dressing on post op day 3. Leave steri-strips on back.  They will fall off by themselves. Do not put any creams, lotions, or ointments on incision. You are fine to shower. Let water run over incision and pat dry.  Activity Walk each and every day, increasing distance each day. No lifting greater than 5 lbs.  Avoid excessive back motion. No driving for 2 weeks; may ride as a passenger locally.  Diet Resume your normal diet.  Call Your Doctor If Any of These Occur Redness, drainage, or swelling at the wound.  Temperature greater than 101 degrees. Severe pain not relieved by pain medication. Incision starts to come apart.  Follow Up Appt Call [phone] today for appointment in 2-3 weeks if you don't already have one or for any problems.  If you have any hardware placed in your spine, you will need an x-ray before your appointment.  

## 2024-02-23 NOTE — Care Management CC44 (Signed)
 Condition Code 44 Documentation Completed  Patient Details  Name: LEANDER TOUT MRN: [id number] Date of Birth: 06/24/1941   Condition Code 44 given:  Yes Patient signature on Condition Code 44 notice:  Yes Documentation of 2 MD's agreement:  Yes Code 44 added to claim:  Yes    Lockie Pares, RN 02/23/2024, 12:41 PM

## 2024-02-23 NOTE — Care Management (Signed)
  Transition of Care [facility]) Screening Note   Patient Details  Name: George Johnston Date of Birth: 1941/01/09   [facility] of [facility]) CM/SW Contact:    Lockie Pares, RN Phone Number: 02/23/2024, 10:53 AM    Transition of Care Department [facility]) has reviewed patient and no TOC needs have been identified at this time. We will continue to monitor patient advancement through interdisciplinary progression rounds. If new patient transition needs arise, please place a TOC consult.

## 2024-02-23 NOTE — Evaluation (Signed)
 Physical Therapy Evaluation  Patient Details Name: George Johnston MRN: [id number] DOB: [birth year] Today's Date: 02/23/2024  History of Present Illness  Pt is an 83 y/o male who presents s/p L5-S1 PLIF on 02/22/2024. PMH significant for Aortic stenosis, HTN, hypothyroidism, permanent cardiac pacemaker, RA, spinal cord stimulator placement, B TKA.  Clinical Impression  Pt admitted with above diagnosis. At the time of PT eval, pt was able to demonstrate transfers and ambulation with gross CGA to supervision for safety and RW for support. Pt was educated on precautions, brace application/wearing schedule, appropriate activity progression, and car transfer. Pt currently with functional limitations due to the deficits listed below (see PT Problem List). Pt will benefit from skilled PT to increase their independence and safety with mobility to allow discharge to the venue listed below.          If plan is discharge home, recommend the following: A little help with walking and/or transfers;A little help with bathing/dressing/bathroom;Assistance with cooking/housework;Assist for transportation;Help with stairs or ramp for entrance   Can travel by private vehicle        Equipment Recommendations None recommended by PT  Recommendations for Other Services  OT consult    Functional Status Assessment Patient has had a recent decline in their functional status and demonstrates the ability to make significant improvements in function in a reasonable and predictable amount of time.     Precautions / Restrictions Precautions Precautions: Fall;Back Precaution Booklet Issued: Yes (comment) Recall of Precautions/Restrictions: Intact Restrictions Weight Bearing Restrictions Per Provider Order: No      Mobility  Bed Mobility Overal bed mobility: Needs Assistance Bed Mobility: Rolling, Sidelying to Sit Rolling: Supervision Sidelying to sit: Supervision       General bed mobility comments: VC's for  log roll technique. HOB slightly elevated and no use of rails to simulate home environment.    Transfers Overall transfer level: Needs assistance Equipment used: Rolling walker (2 wheels) Transfers: Sit to/from Stand Sit to Stand: Contact guard assist           General transfer comment: VC's for hand placement on seated surface for safety. No assist required.    Ambulation/Gait Ambulation/Gait assistance: Contact guard assist, Supervision Gait Distance (Feet): 400 Feet Assistive device: Rolling walker (2 wheels) Gait Pattern/deviations: Step-through pattern, Decreased stride length, Trunk flexed Gait velocity: Decreased Gait velocity interpretation: <1.8 ft/sec, indicate of risk for recurrent falls   General Gait Details: VC's for improved posture, closer walker proximity and forward gaze. Flexed trunk throughout gait training. No overt LOB noted.  Stairs Stairs: Yes Stairs assistance: Contact guard assist Stair Management: One rail Left, Step to pattern, Sideways Number of Stairs: 5 General stair comments: VC's for sequencing and general safety.  Wheelchair Mobility     Tilt Bed    Modified Rankin (Stroke Patients Only)       Balance                                             Pertinent Vitals/Pain Pain Assessment Pain Assessment: Faces Faces Pain Scale: Hurts little more Pain Location: back Pain Descriptors / Indicators: Operative site guarding, Sore Pain Intervention(s): Limited activity within patient's tolerance, Monitored during session, Repositioned    Home Living Family/patient expects to be discharged to:: Private residence Living Arrangements: Children;Other relatives Available Help at Discharge: Family;Available 24 hours/day Type of Home: House Home  Access: Stairs to enter Entrance Stairs-Rails: Left Entrance Stairs-Number of Steps: 2 Alternate Level Stairs-Number of Steps: flight Home Layout: Two level;Able to live on main  level with bedroom/bathroom Home Equipment: Rolling Walker (2 wheels);Rollator (4 wheels);Shower seat - built in      Prior Function Prior Level of Function : Independent/Modified Independent             Mobility Comments: No AD but pt furniture cruising and pulling self up with arms to get up the stairs ADLs Comments: Independent     Extremity/Trunk Assessment   Upper Extremity Assessment Upper Extremity Assessment: Defer to OT evaluation    Lower Extremity Assessment Lower Extremity Assessment: Generalized weakness (Mild; consistent with pre-op diagnosis)    Cervical / Trunk Assessment Cervical / Trunk Assessment: Back Surgery  Communication   Communication Communication: No apparent difficulties    Cognition Arousal: Alert Behavior During Therapy: WFL for tasks assessed/performed   PT - Cognitive impairments: No apparent impairments                         Following commands: Intact       Cueing Cueing Techniques: Verbal cues, Gestural cues     General Comments      Exercises     Assessment/Plan    PT Assessment Patient needs continued PT services  PT Problem List Decreased strength;Decreased activity tolerance;Decreased balance;Decreased mobility;Decreased knowledge of use of DME;Decreased safety awareness;Decreased knowledge of precautions;Pain       PT Treatment Interventions DME instruction;Gait training;Stair training;Functional mobility training;Therapeutic activities;Therapeutic exercise;Balance training;Patient/family education    PT Goals (Current goals can be found in the Care Plan section)  Acute Rehab PT Goals Patient Stated Goal: Home today, get back to playing golf PT Goal Formulation: With patient Time For Goal Achievement: 03/01/24 Potential to Achieve Goals: Good    Frequency Min 5X/week     Co-evaluation               AM-PAC PT "6 Clicks" Mobility  Outcome Measure Help needed turning from your back to your  side while in a flat bed without using bedrails?: A Little Help needed moving from lying on your back to sitting on the side of a flat bed without using bedrails?: A Little Help needed moving to and from a bed to a chair (including a wheelchair)?: A Little Help needed standing up from a chair using your arms (e.g., wheelchair or bedside chair)?: A Little Help needed to walk in hospital room?: A Little Help needed climbing 3-5 steps with a railing? : A Little 6 Click Score: 18    End of Session Equipment Utilized During Treatment: Gait belt;Back brace Activity Tolerance: Patient tolerated treatment well Patient left: in chair;with call bell/phone within reach Nurse Communication: Mobility status PT Visit Diagnosis: Unsteadiness on feet (R26.81);Pain Pain - part of body:  (bck)    Time: 1610-9604 PT Time Calculation (min) (ACUTE ONLY): 25 min   Charges:   PT Evaluation $PT Eval Low Complexity: 1 Low PT Treatments $Gait Training: 8-22 mins PT General Charges $$ ACUTE PT VISIT: 1 Visit         Conni Slipper, PT, DPT Acute Rehabilitation Services Secure Chat Preferred Office: [phone]   Marylynn Pearson 02/23/2024, 9:29 AM

## 2024-02-26 MED FILL — Heparin Sodium (Porcine) Inj 1000 Unit/ML: INTRAMUSCULAR | Qty: 30 | Status: AC

## 2024-02-29 NOTE — Progress Notes (Unsigned)
  Electrophysiology Office Note:   Date:  03/01/2024  ID:  ROBBERT LANGLINAIS, DOB 1941/03/25, MRN [id number]  Primary Cardiologist: None Primary Heart Failure: None Electrophysiologist: Will Jorja Loa, MD       History of Present Illness:   George Johnston is a 83 y.o. male with h/o CHB s/p PPM, RA seen today for routine electrophysiology followup.   Since last being seen in our clinic the patient reports he has done well since his device was placed. He had back surgery last week and is recovering well.  No device related concerns.   He denies chest pain, palpitations, dyspnea, PND, orthopnea, nausea, vomiting, dizziness, syncope, edema, weight gain, or early satiety.   Review of systems complete and found to be negative unless listed in HPI.   EP Information / Studies Reviewed:    EKG is not ordered today. EKG from 01/11/24 reviewed which showed SR VP 66 bpm      PPM Interrogation-  reviewed in detail today,  See PACEART report.  Device History: Abbott Dual Chamber PPM implanted 11/30/23 for CHB No R Waves at 40 bpm  Studies:  ECHO 11/2023 > LVEF 70-75%, no RWMA, severe asymmetric LV hypertrophy of the basal-septal segment, LV diastolic dysfunction, G1DD, LA mildly dilated, mitral valve degenerative / severe mitral annual calcification    Arrhythmia / AAD CHB        Physical Exam:   VS:  BP 130/72   Pulse 88   Ht 5' 9.5" (1.765 m)   Wt 154 lb 9.6 oz (70.1 kg)   SpO2 96%   BMI 22.50 kg/m    Wt Readings from Last 3 Encounters:  03/01/24 154 lb 9.6 oz (70.1 kg)  02/22/24 140 lb (63.5 kg)  02/15/24 144 lb (65.3 kg)     GEN: Well nourished, well developed in no acute distress NECK: No JVD; No carotid bruits CARDIAC: Regular rate and rhythm, 2-3/6 SEM murmurs, rubs, gallops RESPIRATORY:  Clear to auscultation without rales, wheezing or rhonchi  ABDOMEN: Soft, non-tender, non-distended EXTREMITIES:  No edema; No deformity   ASSESSMENT AND PLAN:    Symptomatic  Bradycardia / CHB s/p Abbott PPM  -Normal PPM function -See Pace Art report -No changes today  Hypertension  -well controlled on current regimen    Systolic Murmur  [id number]  Severe MV calcification, degenerative MV, calcified AV, trivial AVR  -has not seen primary cardiology, refer to establish care    Disposition:   Follow up with Dr. Elberta Fortis in 12 months  Signed, Canary Brim, NP-C, AGACNP-BC [facility] - Electrophysiology  03/01/2024, 2:09 PM

## 2024-03-01 ENCOUNTER — Encounter: Payer: Self-pay | Admitting: Pulmonary Disease

## 2024-03-01 ENCOUNTER — Ambulatory Visit: Payer: BLUE CROSS/BLUE SHIELD | Attending: Cardiovascular Disease | Admitting: Pulmonary Disease

## 2024-03-01 ENCOUNTER — Ambulatory Visit: Payer: Medicare Other

## 2024-03-01 VITALS — BP 130/72 | HR 88 | Ht 69.5 in | Wt 154.6 lb

## 2024-03-01 DIAGNOSIS — R011 Cardiac murmur, unspecified: Secondary | ICD-10-CM

## 2024-03-01 DIAGNOSIS — I442 Atrioventricular block, complete: Secondary | ICD-10-CM

## 2024-03-01 DIAGNOSIS — R001 Bradycardia, unspecified: Secondary | ICD-10-CM

## 2024-03-01 DIAGNOSIS — I340 Nonrheumatic mitral (valve) insufficiency: Secondary | ICD-10-CM

## 2024-03-01 DIAGNOSIS — I10 Essential (primary) hypertension: Secondary | ICD-10-CM | POA: Diagnosis present

## 2024-03-01 LAB — CUP PACEART REMOTE DEVICE CHECK
Battery Remaining Longevity: 117 mo
Battery Remaining Percentage: 95.5 %
Battery Voltage: 2.99 V
Brady Statistic AP VP Percent: 1 %
Brady Statistic AP VS Percent: 0 %
Brady Statistic AS VP Percent: 99 %
Brady Statistic AS VS Percent: 1 %
Brady Statistic RA Percent Paced: 1 %
Brady Statistic RV Percent Paced: 99 %
Date Time Interrogation Session: 20250321125114
Implantable Lead Connection Status: 753985
Implantable Lead Connection Status: 753985
Implantable Lead Implant Date: 20241219
Implantable Lead Implant Date: 20241219
Implantable Lead Location: 753859
Implantable Lead Location: 753860
Implantable Pulse Generator Implant Date: 20241219
Lead Channel Impedance Value: 430 Ohm
Lead Channel Impedance Value: 490 Ohm
Lead Channel Pacing Threshold Amplitude: 0.75 V
Lead Channel Pacing Threshold Amplitude: 1 V
Lead Channel Pacing Threshold Pulse Width: 0.5 ms
Lead Channel Pacing Threshold Pulse Width: 0.5 ms
Lead Channel Sensing Intrinsic Amplitude: 12 mV
Lead Channel Sensing Intrinsic Amplitude: 3.9 mV
Lead Channel Setting Pacing Amplitude: 1 V
Lead Channel Setting Pacing Amplitude: 2 V
Lead Channel Setting Pacing Pulse Width: 0.5 ms
Lead Channel Setting Sensing Sensitivity: 4 mV
Pulse Gen Model: 2272
Pulse Gen Serial Number: 8223380

## 2024-03-01 LAB — CUP PACEART INCLINIC DEVICE CHECK
Battery Remaining Longevity: 120 mo
Battery Voltage: 2.99 V
Brady Statistic RA Percent Paced: 0.61 %
Brady Statistic RV Percent Paced: 99.64 %
Date Time Interrogation Session: 20250321140701
Implantable Lead Connection Status: 753985
Implantable Lead Connection Status: 753985
Implantable Lead Implant Date: 20241219
Implantable Lead Implant Date: 20241219
Implantable Lead Location: 753859
Implantable Lead Location: 753860
Implantable Pulse Generator Implant Date: 20241219
Lead Channel Impedance Value: 425 Ohm
Lead Channel Impedance Value: 487.5 Ohm
Lead Channel Pacing Threshold Amplitude: 0.625 V
Lead Channel Pacing Threshold Amplitude: 1 V
Lead Channel Pacing Threshold Pulse Width: 0.5 ms
Lead Channel Pacing Threshold Pulse Width: 0.5 ms
Lead Channel Sensing Intrinsic Amplitude: 12 mV
Lead Channel Sensing Intrinsic Amplitude: 3.9 mV
Lead Channel Setting Pacing Amplitude: 0.875
Lead Channel Setting Pacing Amplitude: 2 V
Lead Channel Setting Pacing Pulse Width: 0.5 ms
Lead Channel Setting Sensing Sensitivity: 4 mV
Pulse Gen Model: 2272
Pulse Gen Serial Number: 8223380

## 2024-03-01 NOTE — Patient Instructions (Signed)
 Medication Instructions:  Your physician recommends that you continue on your current medications as directed. Please refer to the Current Medication list given to you today.  *If you need a refill on your cardiac medications before your next appointment, please call your pharmacy*  Lab Work: None ordered If you have labs (blood work) drawn today and your tests are completely normal, you will receive your results only by: MyChart Message (if you have MyChart) OR A paper copy in the mail If you have any lab test that is abnormal or we need to change your treatment, we will call you to review the results.  Follow-Up: At [facility], you and your health needs are our priority.  As part of our continuing mission to provide you with exceptional heart care, we have created designated Provider Care Teams.  These Care Teams include your primary Cardiologist (physician) and Advanced Practice Providers (APPs -  Physician Assistants and Nurse Practitioners) who all work together to provide you with the care you need, when you need it.  Your next appointment:   1 year(s)  Provider:   Loman Brooklyn, MD or Canary Brim, NP    You have been referred to see general cardiology, Dr Cristal Deer.

## 2024-04-09 NOTE — Progress Notes (Signed)
 Remote pacemaker transmission.

## 2024-05-31 ENCOUNTER — Ambulatory Visit: Payer: Self-pay | Admitting: Cardiology

## 2024-05-31 ENCOUNTER — Ambulatory Visit (INDEPENDENT_AMBULATORY_CARE_PROVIDER_SITE_OTHER): Payer: Medicare Other

## 2024-05-31 DIAGNOSIS — I442 Atrioventricular block, complete: Secondary | ICD-10-CM | POA: Diagnosis not present

## 2024-05-31 LAB — CUP PACEART REMOTE DEVICE CHECK
Battery Remaining Longevity: 114 mo
Battery Remaining Percentage: 95 %
Battery Voltage: 3.01 V
Brady Statistic AP VP Percent: 2.2 %
Brady Statistic AP VS Percent: 1 %
Brady Statistic AS VP Percent: 97 %
Brady Statistic AS VS Percent: 1 %
Brady Statistic RA Percent Paced: 2.2 %
Brady Statistic RV Percent Paced: 99 %
Date Time Interrogation Session: 20250620040015
Implantable Lead Connection Status: 753985
Implantable Lead Connection Status: 753985
Implantable Lead Implant Date: 20241219
Implantable Lead Implant Date: 20241219
Implantable Lead Location: 753859
Implantable Lead Location: 753860
Implantable Pulse Generator Implant Date: 20241219
Lead Channel Impedance Value: 430 Ohm
Lead Channel Impedance Value: 480 Ohm
Lead Channel Pacing Threshold Amplitude: 0.75 V
Lead Channel Pacing Threshold Amplitude: 1 V
Lead Channel Pacing Threshold Pulse Width: 0.5 ms
Lead Channel Pacing Threshold Pulse Width: 0.5 ms
Lead Channel Sensing Intrinsic Amplitude: 12 mV
Lead Channel Sensing Intrinsic Amplitude: 3.3 mV
Lead Channel Setting Pacing Amplitude: 1 V
Lead Channel Setting Pacing Amplitude: 2 V
Lead Channel Setting Pacing Pulse Width: 0.5 ms
Lead Channel Setting Sensing Sensitivity: 4 mV
Pulse Gen Model: 2272
Pulse Gen Serial Number: 8223380

## 2024-07-17 NOTE — Progress Notes (Signed)
 Cardiology Office Note:  .   Date:  07/18/2024  ID:  George Johnston, DOB August 30, 1941, MRN [id number] PCP: Henry Ingle, MD  [facility] Providers Cardiologist:  Shelda Bruckner, MD Electrophysiologist:  Will Gladis Norton, MD {  History of Present Illness: .   George Johnston is a 83 y.o. male with PMH complete heart block s/p PPM, hypertension, murmur referred for evaluation of murmur/abnormal echo.  Pertinent CV history: Underwent pacemaker implant 11/30/23 for complete heart block/symptomatic bradycardia. 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  Today: Referred 03/01/24 from Daphne Barrack, NP in EP clinic, given abnormalities on echo 11/2023. I reviewed these findings with patient today.  Overall he has been ok physically, though has been depressed with the loss of his wife (was just 1 year anniversary).   Asking about what causes a murmur, reviewed. Also discussed CT imaging for his ascending aorta, he is amenable.  Blood pressure a little low here today, but asymptomatic. Reviewed home log, range 122/62-153/94.  ROS: Denies chest pain, shortness of breath at rest or with normal exertion. No PND, orthopnea, LE edema or unexpected weight gain. No syncope or palpitations. ROS otherwise negative except as noted.   Studies Reviewed: SABRA    EKG:       Physical Exam:   VS:  BP (!) 93/50 Comment: automatic cuff  Pulse 96   Ht 5' 10 (1.778 m)   Wt 163 lb (73.9 kg)   SpO2 95%   BMI 23.39 kg/m    Wt Readings from Last 3 Encounters:  07/18/24 163 lb (73.9 kg)  03/01/24 154 lb 9.6 oz (70.1 kg)  02/22/24 140 lb (63.5 kg)    GEN: Well nourished, well developed in no acute distress HEENT: Normal, moist mucous membranes NECK: No JVD CARDIAC: regular rhythm, normal S1 and S2, no rubs or gallops. 2/6  systolic murmur throughout sternal area of precordium VASCULAR: Radial and DP pulses 2+ bilaterally. No carotid bruits RESPIRATORY:  Clear to auscultation without rales, wheezing or rhonchi  ABDOMEN: Soft, non-tender, non-distended MUSCULOSKELETAL:  Ambulates independently SKIN: Warm and dry, no edema NEUROLOGIC:  Alert and oriented x 3. No focal neuro deficits noted. PSYCHIATRIC:  Normal affect    ASSESSMENT AND PLAN: .    History of complete heart block s/p PPM -stable, followed by EP  Murmur -reviewed his echo, no significant valve disease. Murmur sounds more consistent with LVOT turbulence from sigmoid septum  Thoracic ascending aorta -measured 4.6 on echo. Discussed CT angio, he is amenable. Normal kidney function March 2025.  Hypertension -has been well controlled, a little low today but asymptomatic. Home logs normal, reviewed.  CV risk counseling and prevention -recommend heart healthy/Mediterranean diet, with whole grains, fruits, vegetable, fish, lean meats, nuts, and olive oil. Limit salt. -recommend moderate walking, 3-5 times/week for 30-50 minutes each session. Aim for at least 150 minutes.week. Goal should be pace of 3 miles/hours, or walking 1.5 miles in 30 minutes -recommend avoidance of tobacco products. Avoid excess alcohol . -ASCVD risk score: The ASCVD Risk score (Arnett DK, et al., 2019) failed to calculate for the following reasons:   The 2019 ASCVD risk score is only valid for ages 81 to 72    Dispo: 1 year or sooner as needed  Signed, Shelda Bruckner, MD   Shelda Bruckner, MD, PhD, FACC [facility]  [facility]  [facility] [address], [address] [place], [place] [address] [phone]

## 2024-07-18 ENCOUNTER — Encounter (HOSPITAL_BASED_OUTPATIENT_CLINIC_OR_DEPARTMENT_OTHER): Payer: Self-pay | Admitting: Cardiology

## 2024-07-18 ENCOUNTER — Ambulatory Visit (INDEPENDENT_AMBULATORY_CARE_PROVIDER_SITE_OTHER): Admitting: Cardiology

## 2024-07-18 VITALS — BP 93/50 | HR 96 | Ht 70.0 in | Wt 163.0 lb

## 2024-07-18 DIAGNOSIS — I10 Essential (primary) hypertension: Secondary | ICD-10-CM

## 2024-07-18 DIAGNOSIS — I7121 Aneurysm of the ascending aorta, without rupture: Secondary | ICD-10-CM

## 2024-07-18 DIAGNOSIS — R011 Cardiac murmur, unspecified: Secondary | ICD-10-CM

## 2024-07-18 DIAGNOSIS — I442 Atrioventricular block, complete: Secondary | ICD-10-CM | POA: Diagnosis not present

## 2024-07-18 DIAGNOSIS — Z712 Person consulting for explanation of examination or test findings: Secondary | ICD-10-CM | POA: Diagnosis not present

## 2024-07-18 NOTE — Patient Instructions (Addendum)
-  Medication Instructions:   Your physician recommends that you continue on your current medications as directed. Please refer to the Current Medication list given to you today.   *If you need a refill on your cardiac medications before your next appointment, please call your pharmacy*  Lab Work:   TODAY!! BMET  If you have labs (blood work) drawn today and your tests are completely normal, you will receive your results only by: MyChart Message (if you have MyChart) OR A paper copy in the mail If you have any lab test that is abnormal or we need to change your treatment, we will call you to review the results.  Testing/Procedures:  Non-Cardiac CT Angiography (CTA), is a special type of CT scan that uses a computer to produce multi-dimensional views of major blood vessels throughout the body. In CT angiography, a contrast material is injected through an IV to help visualize the blood vessels.    Follow-Up: At [facility], you and your health needs are our priority.  As part of our continuing mission to provide you with exceptional heart care, our providers are all part of one team.  This team includes your primary Cardiologist (physician) and Advanced Practice Providers or APPs (Physician Assistants and Nurse Practitioners) who all work together to provide you with the care you need, when you need it.  Your next appointment:   1 year(s)  Provider:   Shelda Bruckner, MD    We recommend signing up for the patient portal called MyChart.  Sign up information is provided on this After Visit Summary.  MyChart is used to connect with patients for Virtual Visits (Telemedicine).  Patients are able to view lab/test results, encounter notes, upcoming appointments, etc.  Non-urgent messages can be sent to your provider as well.   To learn more about what you can do with MyChart, go to [email or web address].   Other Instructions  Your physician wants you to follow-up in: 1  YEAR.  You will receive a reminder letter in the mail two months in advance. If you don't receive a letter, please call our office to schedule the follow-up appointment.

## 2024-07-19 LAB — BASIC METABOLIC PANEL WITH GFR
BUN/Creatinine Ratio: 20 (ref 10–24)
BUN: 24 mg/dL (ref 8–27)
CO2: 24 mmol/L (ref 20–29)
Calcium: 9.5 mg/dL (ref 8.6–10.2)
Chloride: 101 mmol/L (ref 96–106)
Creatinine, Ser: 1.22 mg/dL (ref 0.76–1.27)
Glucose: 128 mg/dL — ABNORMAL HIGH (ref 70–99)
Potassium: 4.4 mmol/L (ref 3.5–5.2)
Sodium: 140 mmol/L (ref 134–144)
eGFR: 60 mL/min/1.73 (ref >59)

## 2024-07-24 ENCOUNTER — Ambulatory Visit (HOSPITAL_BASED_OUTPATIENT_CLINIC_OR_DEPARTMENT_OTHER): Payer: Self-pay | Admitting: Cardiology

## 2024-07-29 ENCOUNTER — Telehealth: Payer: Self-pay

## 2024-07-29 NOTE — Telephone Encounter (Signed)
   Pre-operative Risk Assessment    Patient Name: George Johnston  DOB: March 05, 1941 MRN: [id number]   Date of last office visit: 07/18/24 SHELDA BRUCKNER, MD Date of next office visit: NONE   Request for Surgical Clearance    Procedure:  CATARACT EXTRACTION EXTRACTION BY PE, IOL - LEFT EYE THEN RIGHT EYE  Date of Surgery:  Clearance 08/16/24         AND     08/30/24                       Surgeon:  DR BRUCKNER IVAR FAIRLY Surgeon's Group or Practice Name:  [facility] Phone number:  [phone]   EXT 5125 Fax number:  [phone]   Type of Clearance Requested:   - Medical    Type of Anesthesia:  IV SEDATION   Additional requests/questions:    Signed, Lucie DELENA Ku   07/29/2024, 2:04 PM

## 2024-07-29 NOTE — Progress Notes (Signed)
 Remote pacemaker transmission.

## 2024-07-30 NOTE — Telephone Encounter (Signed)
   Patient Name: NIKE SOUTHWELL  DOB: 03-01-1941 MRN: [id number]  Primary Cardiologist: Shelda Bruckner, MD  Chart reviewed as part of pre-operative protocol coverage. Cataract extractions are recognized in guidelines as low risk surgeries that do not typically require specific preoperative testing or holding of blood thinner therapy. Therefore, given past medical history and time since last visit, based on ACC/AHA guidelines, JOHNWILLIAM SHEPPERSON would be at acceptable risk for the planned procedure without further cardiovascular testing.   I will route this recommendation to the requesting party via Epic fax function and remove from pre-op pool.  Please call with questions.  Lum LITTIE Louis, NP 07/30/2024, 12:42 PM

## 2024-08-01 ENCOUNTER — Other Ambulatory Visit

## 2024-08-01 ENCOUNTER — Ambulatory Visit
Admission: RE | Admit: 2024-08-01 | Discharge: 2024-08-01 | Disposition: A | Discharge status: Home / Self Care | Source: Ambulatory Visit | Attending: Cardiology | Admitting: Cardiology

## 2024-08-01 DIAGNOSIS — I7121 Aneurysm of the ascending aorta, without rupture: Secondary | ICD-10-CM

## 2024-08-01 MED ORDER — IOPAMIDOL (ISOVUE-370) INJECTION 76%
75.0000 mL | Freq: Once | INTRAVENOUS | Status: AC | PRN
  Administered 2024-08-01: 75 mL via INTRAVENOUS

## 2024-08-08 ENCOUNTER — Telehealth: Payer: Self-pay | Admitting: Cardiology

## 2024-08-08 NOTE — Telephone Encounter (Signed)
 Received call report from radiology   Focal consolidative airspace opacity present in the posterior aspect of the right upper lobe concerning for active bronchopneumonia. Ultimately, a primary bronchogenic malignancy could have a similar appearance. Recommend clinical correlation for signs and symptoms of pneumonia as well as repeat CT scan in approximately 6 weeks following an appropriate course of antimicrobial therapy.  Will forward to Dr Lonni for review

## 2024-08-08 NOTE — Telephone Encounter (Signed)
 Calling in with results. Please advise

## 2024-08-09 NOTE — Telephone Encounter (Signed)
 Left message to call back.

## 2024-08-28 NOTE — Telephone Encounter (Signed)
 No call back, called to follow up spoke with [facility].  CT not received, confirmed fax number and sent again  Asked Rosaline if not received to please call back and will send again

## 2024-08-30 ENCOUNTER — Ambulatory Visit (INDEPENDENT_AMBULATORY_CARE_PROVIDER_SITE_OTHER): Payer: Medicare Other

## 2024-08-30 DIAGNOSIS — I442 Atrioventricular block, complete: Secondary | ICD-10-CM

## 2024-08-30 LAB — CUP PACEART REMOTE DEVICE CHECK
Battery Remaining Longevity: 111 mo
Battery Remaining Percentage: 93 %
Battery Voltage: 3.01 V
Brady Statistic AP VP Percent: 3.4 %
Brady Statistic AP VS Percent: 1 %
Brady Statistic AS VP Percent: 96 %
Brady Statistic AS VS Percent: 1 %
Brady Statistic RA Percent Paced: 3.3 %
Brady Statistic RV Percent Paced: 99 %
Date Time Interrogation Session: 20250919040013
Implantable Lead Connection Status: 753985
Implantable Lead Connection Status: 753985
Implantable Lead Implant Date: 20241219
Implantable Lead Implant Date: 20241219
Implantable Lead Location: 753859
Implantable Lead Location: 753860
Implantable Pulse Generator Implant Date: 20241219
Lead Channel Impedance Value: 430 Ohm
Lead Channel Impedance Value: 450 Ohm
Lead Channel Pacing Threshold Amplitude: 0.75 V
Lead Channel Pacing Threshold Amplitude: 0.875 V
Lead Channel Pacing Threshold Pulse Width: 0.5 ms
Lead Channel Pacing Threshold Pulse Width: 0.5 ms
Lead Channel Sensing Intrinsic Amplitude: 12 mV
Lead Channel Sensing Intrinsic Amplitude: 2.4 mV
Lead Channel Setting Pacing Amplitude: 1 V
Lead Channel Setting Pacing Amplitude: 1.875
Lead Channel Setting Pacing Pulse Width: 0.5 ms
Lead Channel Setting Sensing Sensitivity: 4 mV
Pulse Gen Model: 2272
Pulse Gen Serial Number: 8223380

## 2024-08-31 ENCOUNTER — Ambulatory Visit: Payer: Self-pay | Admitting: Cardiology

## 2024-09-03 NOTE — Progress Notes (Signed)
 Remote PPM Transmission

## 2024-11-29 ENCOUNTER — Ambulatory Visit: Payer: Medicare Other

## 2024-11-29 DIAGNOSIS — I442 Atrioventricular block, complete: Secondary | ICD-10-CM

## 2024-11-30 LAB — CUP PACEART REMOTE DEVICE CHECK
Battery Remaining Longevity: 109 mo
Battery Remaining Percentage: 91 %
Battery Voltage: 3.01 V
Brady Statistic AP VP Percent: 3.1 %
Brady Statistic AP VS Percent: 1 %
Brady Statistic AS VP Percent: 97 %
Brady Statistic AS VS Percent: 1 %
Brady Statistic RA Percent Paced: 2.8 %
Brady Statistic RV Percent Paced: 99 %
Date Time Interrogation Session: 20251219040013
Implantable Lead Connection Status: 753985
Implantable Lead Connection Status: 753985
Implantable Lead Implant Date: 20241219
Implantable Lead Implant Date: 20241219
Implantable Lead Location: 753859
Implantable Lead Location: 753860
Implantable Pulse Generator Implant Date: 20241219
Lead Channel Impedance Value: 460 Ohm
Lead Channel Impedance Value: 480 Ohm
Lead Channel Pacing Threshold Amplitude: 0.75 V
Lead Channel Pacing Threshold Amplitude: 1 V
Lead Channel Pacing Threshold Pulse Width: 0.5 ms
Lead Channel Pacing Threshold Pulse Width: 0.5 ms
Lead Channel Sensing Intrinsic Amplitude: 12 mV
Lead Channel Sensing Intrinsic Amplitude: 2.2 mV
Lead Channel Setting Pacing Amplitude: 1 V
Lead Channel Setting Pacing Amplitude: 2 V
Lead Channel Setting Pacing Pulse Width: 0.5 ms
Lead Channel Setting Sensing Sensitivity: 4 mV
Pulse Gen Model: 2272
Pulse Gen Serial Number: 8223380

## 2024-12-02 NOTE — Progress Notes (Signed)
 Remote PPM Transmission

## 2024-12-06 ENCOUNTER — Ambulatory Visit: Payer: Self-pay | Admitting: Cardiology
